# Patient Record
Sex: Female | Born: 1980
Health system: Southern US, Community
[De-identification: ages and names within clinical notes are randomized; demographics above are authoritative.]

## PROBLEM LIST (undated history)

## (undated) DIAGNOSIS — N390 Urinary tract infection, site not specified: Secondary | ICD-10-CM

## (undated) DIAGNOSIS — G43909 Migraine, unspecified, not intractable, without status migrainosus: Secondary | ICD-10-CM

## (undated) DIAGNOSIS — E7800 Pure hypercholesterolemia, unspecified: Secondary | ICD-10-CM

## (undated) DIAGNOSIS — B019 Varicella without complication: Secondary | ICD-10-CM

## (undated) DIAGNOSIS — J45990 Exercise induced bronchospasm: Secondary | ICD-10-CM

## (undated) HISTORY — DX: Pure hypercholesterolemia, unspecified: E78.00

## (undated) HISTORY — DX: Varicella without complication: B01.9

## (undated) HISTORY — DX: Urinary tract infection, site not specified: N39.0

## (undated) HISTORY — DX: Migraine, unspecified, not intractable, without status migrainosus: G43.909

## (undated) HISTORY — PX: WISDOM TOOTH EXTRACTION: SHX21

## (undated) HISTORY — DX: Exercise induced bronchospasm: J45.990

---
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
## 2000-02-10 ENCOUNTER — Other Ambulatory Visit: Admission: RE | Admit: 2000-02-10 | Discharge: 2000-02-10 | Payer: Self-pay | Admitting: Obstetrics and Gynecology

## 2001-01-18 ENCOUNTER — Encounter: Payer: Self-pay | Admitting: Family Medicine

## 2001-01-18 ENCOUNTER — Encounter: Admission: RE | Admit: 2001-01-18 | Discharge: 2001-01-18 | Payer: Self-pay | Admitting: Family Medicine

## 2001-04-12 ENCOUNTER — Other Ambulatory Visit: Admission: RE | Admit: 2001-04-12 | Discharge: 2001-04-12 | Payer: Self-pay | Admitting: Obstetrics and Gynecology

## 2008-05-05 ENCOUNTER — Inpatient Hospital Stay (HOSPITAL_COMMUNITY): Admission: AD | Admit: 2008-05-05 | Discharge: 2008-05-08 | Payer: Self-pay | Admitting: Obstetrics & Gynecology

## 2011-02-01 NOTE — Op Note (Signed)
Melissa Owens, CASTELL                  ACCOUNT NO.:  [id number]   MEDICAL RECORD NO.:  [id number]          PATIENT TYPE:  INP   LOCATION:  9130                          FACILITY:  [facility]   PHYSICIAN:  Lenoard Aden, M.D.DATE OF BIRTH:  [birth year]   DATE OF PROCEDURE:  05/05/2008  DATE OF DISCHARGE:                               OPERATIVE REPORT   PREOPERATIVE DIAGNOSIS:  Failure to descend at term.   POSTOPERATIVE DIAGNOSIS:  Failure to descend at term, plus right occiput  posterior position.   PROCEDURE:  Primary low-segment transverse cesarean section.   SURGEON:  Lenoard Aden, MD.   ANESTHESIA:  Epidural.   ESTIMATED BLOOD LOSS:  800 mL.   COMPLICATIONS:  None.   DRAINS:  Foley.   COUNTS:  Correct.   The patient was taken to recovery in good condition.   BRIEF OPERATIVE NOTE:  After being apprised of risks of anesthesia,  infection, bleeding, intra-abdominal injury, need for repair, delayed  versus bleeding complications to include bowel and bladder injury, the  patient was brought to the operating room where she was administered  dosing of epidural anesthetic without complications, prepped and draped  in usual sterile fashion.  A Foley catheter was placed.  After achieving  adequate anesthesia, the Marcaine solution was placed.  A Pfannenstiel  skin incision was made with the scalpel and carried down to fascia,  which was nicked in midline and openly transversely using Mayo scissors.  Rectus muscle was dissected sharply in midline.  Peritoneum entered  sharply.  Bladder blade was placed.  Visceral peritoneum scoured sharply  off the lower uterine segment.  A Kerr hysterotomy incision made,  atraumatic delivery, full-term living female from right occiput  posterior position, handed to pediatrician's attendants, Apgars of 8 and  9.  Cord blood collected.  Placenta delivered manually, intact 3-vessel  cord noted, uterus exteriorized, and normal tubes and ovaries  were  noted.  Uterus was curetted using a dry lap pack and closed in 2 running  imbricating layers of 0 Monocryl suture.  Good hemostasis was noted.  Second running layer was placed and interrupted suture was placed in the  midline for hemostasis and at the left lateral margin at this time,  irrigation was accomplished.  Bladder flap inspected and found to be  hemostatic.  Peritoneum inspected and found to be hemostatic.  Fascia then closed  using 0 Monocryl in running fashion.  Skin was closed using staples.  The patient tolerated the procedure well and was transferred to recovery  in good condition.      Lenoard Aden, M.D.  Electronically Signed     RJT/MEDQ  D:  05/05/2008  T:  05/06/2008  Job:  [id number]

## 2015-09-25 DIAGNOSIS — Z0000 Encounter for general adult medical examination without abnormal findings: Secondary | ICD-10-CM | POA: Diagnosis not present

## 2015-09-29 MED FILL — ROSUVASTATIN CALCIUM 5 MG T: 5 | 90 days supply | Qty: 20 | Fill #1

## 2015-10-23 DIAGNOSIS — R799 Abnormal finding of blood chemistry, unspecified: Secondary | ICD-10-CM | POA: Diagnosis not present

## 2015-10-27 DIAGNOSIS — D473 Essential (hemorrhagic) thrombocythemia: Secondary | ICD-10-CM | POA: Diagnosis not present

## 2015-11-19 MED FILL — NORETHIND-ETH ESTRAD 1-0.02: 1-20 | 84 days supply | Qty: 84 | Fill #1

## 2015-12-10 MED FILL — BUPROPION HCL XL 300 MG TAB: 300 | 90 days supply | Qty: 90 | Fill #0

## 2016-01-05 MED FILL — ROSUVASTATIN CALCIUM 5 MG T: 5 | 90 days supply | Qty: 20 | Fill #0

## 2016-02-18 MED FILL — NORETHIND-ETH ESTRAD 1-0.02: 1-20 | 84 days supply | Qty: 84 | Fill #2

## 2016-03-09 DIAGNOSIS — E7800 Pure hypercholesterolemia, unspecified: Secondary | ICD-10-CM | POA: Diagnosis not present

## 2016-03-09 DIAGNOSIS — J45990 Exercise induced bronchospasm: Secondary | ICD-10-CM | POA: Diagnosis not present

## 2016-03-09 DIAGNOSIS — N943 Premenstrual tension syndrome: Secondary | ICD-10-CM | POA: Diagnosis not present

## 2016-03-14 MED FILL — BUPROPION HCL XL 300 MG TAB: 300 | 90 days supply | Qty: 90 | Fill #0

## 2016-04-19 MED FILL — ROSUVASTATIN CALCIUM 5 MG T: 5 | 90 days supply | Qty: 18 | Fill #0

## 2016-06-14 MED FILL — NORETHIND-ETH ESTRAD 1-0.02: 1-20 | 84 days supply | Qty: 84 | Fill #3

## 2016-06-14 MED FILL — BUPROPION HCL XL 300 MG TAB: 300 | 90 days supply | Qty: 90 | Fill #1

## 2016-07-19 DIAGNOSIS — Z01419 Encounter for gynecological examination (general) (routine) without abnormal findings: Secondary | ICD-10-CM | POA: Diagnosis not present

## 2016-07-19 DIAGNOSIS — Z6821 Body mass index (BMI) 21.0-21.9, adult: Secondary | ICD-10-CM | POA: Diagnosis not present

## 2016-07-19 DIAGNOSIS — Z23 Encounter for immunization: Secondary | ICD-10-CM | POA: Diagnosis not present

## 2016-07-19 DIAGNOSIS — Z1151 Encounter for screening for human papillomavirus (HPV): Secondary | ICD-10-CM | POA: Diagnosis not present

## 2016-07-26 MED FILL — ROSUVASTATIN CALCIUM 5 MG T: 5 | 90 days supply | Qty: 18 | Fill #1

## 2016-09-09 DIAGNOSIS — Z0000 Encounter for general adult medical examination without abnormal findings: Secondary | ICD-10-CM | POA: Diagnosis not present

## 2016-09-09 DIAGNOSIS — F339 Major depressive disorder, recurrent, unspecified: Secondary | ICD-10-CM | POA: Diagnosis not present

## 2016-09-09 DIAGNOSIS — E784 Other hyperlipidemia: Secondary | ICD-10-CM | POA: Diagnosis not present

## 2016-09-09 LAB — CBC AND DIFFERENTIAL
HCT: 40 (ref 36–46)
Hemoglobin: 12.8 (ref 12.0–16.0)
Platelets: 446 — AB (ref 150–399)
WBC: 7.5

## 2016-09-09 LAB — LIPID PANEL
CHOLESTEROL: 191 (ref 0–200)
HDL: 70 (ref 35–70)
LDL CALC: 104
TRIGLYCERIDES: 84 (ref 40–160)

## 2016-09-09 LAB — BASIC METABOLIC PANEL
BUN: 10 (ref 4–21)
CREATININE: 0.9 (ref 0.5–1.1)

## 2016-09-09 MED FILL — BUPROPION HCL XL 300 MG TAB: 300 | 90 days supply | Qty: 90 | Fill #0

## 2016-10-19 MED FILL — NORETHIND-ETH ESTRAD 1-0.02: 1-20 | 84 days supply | Qty: 84 | Fill #0

## 2016-11-01 MED FILL — ROSUVASTATIN CALCIUM 5 MG T: 5 | 90 days supply | Qty: 20 | Fill #1

## 2017-01-03 MED FILL — BUPROPION HCL XL 300 MG TAB: 300 | 90 days supply | Qty: 90 | Fill #1

## 2017-02-21 MED FILL — NORETHIND-ETH ESTRAD 1-0.02: 1-20 | 84 days supply | Qty: 84 | Fill #1

## 2017-03-23 DIAGNOSIS — R030 Elevated blood-pressure reading, without diagnosis of hypertension: Secondary | ICD-10-CM | POA: Diagnosis not present

## 2017-03-23 DIAGNOSIS — E782 Mixed hyperlipidemia: Secondary | ICD-10-CM | POA: Diagnosis not present

## 2017-03-23 DIAGNOSIS — J45990 Exercise induced bronchospasm: Secondary | ICD-10-CM | POA: Diagnosis not present

## 2017-03-23 DIAGNOSIS — F339 Major depressive disorder, recurrent, unspecified: Secondary | ICD-10-CM | POA: Diagnosis not present

## 2017-03-23 LAB — LIPID PANEL
Cholesterol: 196 (ref 0–200)
HDL: 65 (ref 35–70)
LDL CALC: 117
Triglycerides: 70 (ref 40–160)

## 2017-03-23 LAB — HEPATIC FUNCTION PANEL
ALK PHOS: 54 (ref 25–125)
ALT: 17 (ref 7–35)
AST: 20 (ref 13–35)
BILIRUBIN, TOTAL: 0.2

## 2017-03-23 LAB — BASIC METABOLIC PANEL
BUN: 9 (ref 4–21)
Creatinine: 0.7 (ref 0.5–1.1)
GLUCOSE: 78
Potassium: 4.9 (ref 3.4–5.3)
Sodium: 139 (ref 137–147)

## 2017-03-23 MED FILL — VENTOLIN HFA 90 MCG INHALER: 108 (90 BAS | 17 days supply | Qty: 18 | Fill #0

## 2017-03-23 MED FILL — BUPROPION HCL XL 300 MG TAB: 300 | 90 days supply | Qty: 90 | Fill #0

## 2017-03-23 MED FILL — ROSUVASTATIN CALCIUM 5 MG T: 5 | 84 days supply | Qty: 12 | Fill #0

## 2017-04-13 MED FILL — QVAR REDIHALER 40 MCG/ACT A: 40 | 30 days supply | Qty: 11 | Fill #0

## 2017-06-23 MED FILL — NORETHIND-ETH ESTRAD 1-0.02: 1-20 | 84 days supply | Qty: 84 | Fill #2

## 2017-06-27 MED FILL — ROSUVASTATIN CALCIUM 5 MG T: 5 | 84 days supply | Qty: 12 | Fill #1

## 2017-07-05 DIAGNOSIS — R7989 Other specified abnormal findings of blood chemistry: Secondary | ICD-10-CM | POA: Diagnosis not present

## 2017-07-27 DIAGNOSIS — Z6823 Body mass index (BMI) 23.0-23.9, adult: Secondary | ICD-10-CM | POA: Diagnosis not present

## 2017-07-27 DIAGNOSIS — Z01419 Encounter for gynecological examination (general) (routine) without abnormal findings: Secondary | ICD-10-CM | POA: Diagnosis not present

## 2017-07-27 DIAGNOSIS — Z1151 Encounter for screening for human papillomavirus (HPV): Secondary | ICD-10-CM | POA: Diagnosis not present

## 2017-07-28 MED FILL — BUPROPION HCL XL 300 MG TAB: 300 | 60 days supply | Qty: 60 | Fill #1

## 2017-09-20 MED FILL — NORETHIND-ETH ESTRAD 1-0.02: 1-20 | 84 days supply | Qty: 84 | Fill #0

## 2017-09-20 MED FILL — BUPROPION HCL XL 300 MG TAB: 300 | 30 days supply | Qty: 30 | Fill #2

## 2017-09-26 MED FILL — ROSUVASTATIN CALCIUM 5 MG T: 5 | 84 days supply | Qty: 12 | Fill #0

## 2017-11-09 MED FILL — BUPROPION HCL XL 300 MG TAB: 300 | 90 days supply | Qty: 90 | Fill #0

## 2017-11-21 ENCOUNTER — Encounter: Payer: Self-pay | Admitting: Family Medicine

## 2017-11-21 ENCOUNTER — Telehealth: Payer: Self-pay | Admitting: Family Medicine

## 2017-11-21 DIAGNOSIS — J45990 Exercise induced bronchospasm: Secondary | ICD-10-CM

## 2017-11-21 DIAGNOSIS — E7849 Other hyperlipidemia: Secondary | ICD-10-CM | POA: Insufficient documentation

## 2017-11-21 DIAGNOSIS — F338 Other recurrent depressive disorders: Secondary | ICD-10-CM

## 2017-11-21 HISTORY — DX: Exercise induced bronchospasm: J45.990

## 2017-11-21 NOTE — Telephone Encounter (Signed)
Received records from [facility], Evang[place] for this patient.  Will abstract and scan as appropriate It looks like she had a CPE on December 2017 They have been monitoring her BP but did not start her on any medication as of yet

## 2017-11-28 NOTE — Progress Notes (Addendum)
[place] at [facility] [address], [place], [place] [address] [phone]  Date:  11/29/2017   Name:  Melissa Owens   DOB:  Aug 23, 1981   MRN:  [id number]  PCP:  Darreld Mclean, MD    Chief Complaint: No chief complaint on file.   History of Present Illness:  Melissa Owens is a 37 y.o. very pleasant female patient who presents with the following:  Here today as a new patient- formerly a Lobbyist pt, [facility] She is generally in good health   Pap: per her GYN  Labs: she thinks about last June  Flu: UTD Tetanus:  UTD  Her husband is Dr. [name], our pharmacist at the [facility] pharmacy She is on medication for her cholesterol  Crestor for her cholesterol   She does see Aloha Gell for her GYN  They have 3 children 9, 9, 7- 2 are adopted from [place], one bio 2 girls and a boy They all attend the same school.   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   Her mood was ok over this past winter - she is on wellbutrin for SAD  No tobacco or drugs  Patient Active Problem List   Diagnosis Date Noted  . Seasonal affective disorder ([place]) 11/21/2017  . 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 Exercise-induced asthma 11/21/2017    Past Medical History:  Diagnosis Date  . Chicken pox   . Exercise-induced asthma 11/21/2017  . High cholesterol   . Migraines   . Urinary tract infection     Past Surgical History:  Procedure Laterality Date  . CESAREAN SECTION  2009  . WISDOM TOOTH EXTRACTION      Social History   Tobacco Use  . Smoking status: Not on file  Substance Use Topics  . Alcohol use: Not on file   . Drug use: Not on file    No family history on file.  Allergies  Allergen Reactions  . Sulfa Antibiotics Hives    Medication list has been reviewed and updated.  Current Outpatient Medications on File Prior to Visit  Medication Sig Dispense Refill  . albuterol (PROVENTIL HFA;VENTOLIN HFA) 108 (90 Base) MCG/ACT inhaler Inhale 2 puffs into the lungs as needed. Inhale two puffs into the lungs every 6 hours as needed.    Marland Kitchen buPROPion (WELLBUTRIN XL) 300 MG 24 hr tablet TAKE 1 TABLET (300 MG TOTAL) BY MOUTH DAILY.    . Multiple Vitamins-Minerals (WOMENS MULTIVITAMIN PO) Take 1 tablet by mouth daily.    . norethindrone-ethinyl estradiol (MICROGESTIN,JUNEL,LOESTRIN) 1-20 MG-MCG tablet Take 1 tablet daily for 21 days per month  4  . rosuvastatin (CRESTOR) 5 MG tablet Take 1/2 tablet (2/5 mg) twice per week  0   No current facility-administered medications on file prior to visit.     Review of Systems:  As per HPI- otherwise negative.   Physical Examination: Vitals:   11/29/17 1159  BP: 132/82  Pulse: 93  Temp: 98.6 F (37 C)  SpO2: 99%   Vitals:   11/29/17 1159  Weight: 133 lb 3.2 oz (60.4 kg)  Height: 5\' 2"  (1.575 m)   Body mass index is 24.36 kg/m. Ideal Body Weight: Weight in (lb) to have BMI = 25: 136.4  GEN: WDWN, NAD, Non-toxic, A & O x 3, looks well  HEENT: Atraumatic, Normocephalic. Neck supple. No masses, No LAD. Bilateral TM wnl, oropharynx normal.  PEERL,EOMI.   Ears and Nose: No external deformity. CV: RRR, No M/G/R. No JVD. No thrill. No extra heart sounds. PULM: CTA B, no wheezes, crackles, rhonchi. No retractions. No resp. distress. No accessory muscle use. ABD: S, NT, ND. EXTR: No c/c/e NEURO Normal gait.  PSYCH: Normally interactive. Conversant. Not depressed or anxious appearing.  Calm demeanor.    Assessment and Plan: Dyslipidemia - Plan: Comprehensive metabolic panel, Lipid panel  Screening for deficiency anemia - Plan: CBC  Screening for  diabetes mellitus - Plan: Comprehensive metabolic panel, Hemoglobin A1c  Family history of hypertension  Here today as a new patient- new insurance so she is changing to this practice.  Her husband is a [employer] employee EIA- controlled Hyperlipidemia She will come in for fasting labs soon She sees GYN She will monitor her BP- family history of HTN   Signed Lamar Blinks, MD  Received her labs 3/14- letter to pt  Lab Results  Component Value Date   WBC 6.4 11/30/2017   HGB 13.8 11/30/2017   HCT 41.4 11/30/2017   MCV 85.8 11/30/2017   PLT 367.0 11/30/2017     Chemistry      Component Value Date/Time   NA 138 11/30/2017 0815   K 4.2 11/30/2017 0815   CL 104 11/30/2017 0815   CO2 29 11/30/2017 0815   BUN 11 11/30/2017 0815   CREATININE 0.98 11/30/2017 0815      Component Value Date/Time   CALCIUM 9.6 11/30/2017 0815   ALKPHOS 49 11/30/2017 0815   AST 21 11/30/2017 0815   ALT 21 11/30/2017 0815   BILITOT 0.4 11/30/2017 0815     Lab Results  Component Value Date   HGBA1C 5.3 11/30/2017   Lipids:    Component Value Date/Time   CHOL 186 11/30/2017 0815   TRIG 79.0 11/30/2017 0815   HDL 66.40 11/30/2017 0815   VLDL 15.8 11/30/2017 0815   CHOLHDL 3 11/30/2017 0815   Lab Results  Component Value Date   LDLCALC 104 (H) 11/30/2017

## 2017-11-29 ENCOUNTER — Encounter: Payer: Self-pay | Admitting: Family Medicine

## 2017-11-29 ENCOUNTER — Ambulatory Visit (INDEPENDENT_AMBULATORY_CARE_PROVIDER_SITE_OTHER): Payer: No Typology Code available for payment source | Admitting: Family Medicine

## 2017-11-29 VITALS — BP 132/82 | HR 93 | Temp 98.6°F | Ht 62.0 in | Wt 133.2 lb

## 2017-11-29 DIAGNOSIS — Z130 Encounter for screening for diseases of the blood and blood-forming organs and certain disorders involving the immune mechanism: Secondary | ICD-10-CM

## 2017-11-29 DIAGNOSIS — E785 Hyperlipidemia, unspecified: Secondary | ICD-10-CM | POA: Diagnosis not present

## 2017-11-29 DIAGNOSIS — Z131 Encounter for screening for diabetes mellitus: Secondary | ICD-10-CM

## 2017-11-29 DIAGNOSIS — Z8249 Family history of ischemic heart disease and other diseases of the circulatory system: Secondary | ICD-10-CM | POA: Diagnosis not present

## 2017-11-29 NOTE — Patient Instructions (Signed)
It was a pleasure to meet you today!  Take care and I will be in touch with your labs; come by when you are fasting at your conveneice and we can draw blood for you.   We will continue to monitor your BP- perhaps check it at home once or twice a month.   Let me know when you need refills and have a wonderful spring

## 2017-11-30 ENCOUNTER — Other Ambulatory Visit (INDEPENDENT_AMBULATORY_CARE_PROVIDER_SITE_OTHER): Payer: No Typology Code available for payment source

## 2017-11-30 DIAGNOSIS — E785 Hyperlipidemia, unspecified: Secondary | ICD-10-CM

## 2017-11-30 DIAGNOSIS — Z131 Encounter for screening for diabetes mellitus: Secondary | ICD-10-CM

## 2017-11-30 DIAGNOSIS — Z130 Encounter for screening for diseases of the blood and blood-forming organs and certain disorders involving the immune mechanism: Secondary | ICD-10-CM

## 2017-11-30 LAB — COMPREHENSIVE METABOLIC PANEL
ALBUMIN: 4.2 g/dL (ref 3.5–5.2)
ALT: 21 U/L (ref 0–35)
AST: 21 U/L (ref 0–37)
Alkaline Phosphatase: 49 U/L (ref 39–117)
BUN: 11 mg/dL (ref 6–23)
CHLORIDE: 104 meq/L (ref 96–112)
CO2: 29 mEq/L (ref 19–32)
Calcium: 9.6 mg/dL (ref 8.4–10.5)
Creatinine, Ser: 0.98 mg/dL (ref 0.40–1.20)
GFR: 67.85 mL/min (ref >60.00)
Glucose, Bld: 88 mg/dL (ref 70–99)
Potassium: 4.2 mEq/L (ref 3.5–5.1)
Sodium: 138 mEq/L (ref 135–145)
TOTAL PROTEIN: 7.1 g/dL (ref 6.0–8.3)
Total Bilirubin: 0.4 mg/dL (ref 0.2–1.2)

## 2017-11-30 LAB — CBC
HEMATOCRIT: 41.4 % (ref 36.0–46.0)
Hemoglobin: 13.8 g/dL (ref 12.0–15.0)
MCHC: 33.4 g/dL (ref 30.0–36.0)
MCV: 85.8 fl (ref 78.0–100.0)
Platelets: 367 10*3/uL (ref 150.0–400.0)
RBC: 4.82 Mil/uL (ref 3.87–5.11)
RDW: 15.4 % (ref 11.5–15.5)
WBC: 6.4 10*3/uL (ref 4.0–10.5)

## 2017-11-30 LAB — LIPID PANEL
CHOLESTEROL: 186 mg/dL (ref 0–200)
HDL: 66.4 mg/dL (ref >39.00)
LDL CALC: 104 mg/dL — AB (ref 0–99)
NonHDL: 119.81
Total CHOL/HDL Ratio: 3
Triglycerides: 79 mg/dL (ref 0.0–149.0)
VLDL: 15.8 mg/dL (ref 0.0–40.0)

## 2017-11-30 LAB — HEMOGLOBIN A1C: Hgb A1c MFr Bld: 5.3 % (ref 4.6–6.5)

## 2017-12-01 ENCOUNTER — Encounter: Payer: Self-pay | Admitting: Family Medicine

## 2017-12-22 ENCOUNTER — Other Ambulatory Visit: Payer: Self-pay | Admitting: Emergency Medicine

## 2017-12-22 MED ORDER — ROSUVASTATIN CALCIUM 5 MG PO TABS: ORAL_TABLET | ORAL | 1 refills | Status: DC

## 2017-12-22 MED FILL — ROSUVASTATIN CALCIUM 5 MG T: 5 | 84 days supply | Qty: 12 | Fill #0

## 2017-12-22 NOTE — Telephone Encounter (Signed)
Received refill request for rosuvastatin calcium 5 mg tab.  Last office visit 11/29/17. Last refill not listed. Refill sent to pt's pharmacy as requested.

## 2018-02-02 MED FILL — NORETHIND-ETH ESTRAD 1-0.02: 1-20 | 84 days supply | Qty: 84 | Fill #1

## 2018-02-06 ENCOUNTER — Other Ambulatory Visit: Payer: Self-pay

## 2018-02-06 MED ORDER — BUPROPION HCL ER (XL) 300 MG PO TB24: ORAL_TABLET | ORAL | 0 refills | Status: DC

## 2018-02-06 MED FILL — buPROPion HCL ER (XL) 300 M: 300 | 90 days supply | Qty: 90 | Fill #0

## 2018-05-31 ENCOUNTER — Other Ambulatory Visit: Payer: Self-pay | Admitting: Family Medicine

## 2018-05-31 MED FILL — BuPROPion HCL ER (XL) 300 M: 300 | 90 days supply | Qty: 90 | Fill #0

## 2018-05-31 MED FILL — NORETHIND-ETH ESTRAD 1-0.02: 1-20 | 84 days supply | Qty: 84 | Fill #2

## 2018-07-26 ENCOUNTER — Telehealth: Payer: Self-pay | Admitting: Family Medicine

## 2018-07-26 NOTE — Telephone Encounter (Signed)
Copied from [place] [id number]. Topic: Quick Communication - See Telephone Encounter >> Jul 26, 2018  7:37 AM Conception Chancy, NT wrote: CRM for notification. See Telephone encounter for: 07/26/18.  Patient is calling to inform Dr. Lorelei Pont of her BP readings she has been monitoring per Dr. Lorelei Pont.  07/24/18 AM 131/81 PM 133/87 irregular heart beat  07/25/18 AM 134/78 PM 140/81 irregular heart beat  07/26/18 AM 139/86 irregular heart beat  She would like to know based off these readings does Dr. Lorelei Pont want to see her again.

## 2018-07-26 NOTE — Telephone Encounter (Signed)
Called her back- she actually was wrong about the irregular heart beat, mis-read a symbol on her home cuff Advised that her BP is borderline, but not to where I would put her on medication at this time.  We will want to monitor her BP- she will check her BP about twice a month and let mer know if any significant change or increase

## 2018-08-07 MED FILL — NORETHIND-ETH ESTRAD 1-0.02: 1-20 | 84 days supply | Qty: 84 | Fill #0

## 2018-08-16 NOTE — Progress Notes (Signed)
[place] at [facility] [address], [place], [place] [address] [phone]  Date:  08/20/2018   Name:  Melissa Owens   DOB:  1981/08/04   MRN:  [id number]  PCP:  Melissa Mclean, MD    Chief Complaint: Medication Follow Up (wellbutrin) and URI (fever, chills, almost a week, productive cough, takin tamiflu since friday)   History of Present Illness:  Melissa Owens is a 37 y.o. very pleasant female patient who presents with the following:  History of SAD, hyperlipidemia Last seen here in March  Her husband is Dr. Luetta Owens, our pharmacist at the [facility] pharmacy She is on medication for her cholesterol  Crestor for her cholesterol  She does see Melissa Owens for her GYN They have 3 children 9, 9, 7- 2 are adopted from [place], one bio 2 girls and a boy They all attend the same school.   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 For exercise she enjoys lifting weights, yoga, a bit of running and walking  Had scheduled this as a med check visit but she is actually sick today She has noted fever, aches, cough, temp to 101.3 for the last 6 days Some ST but this is minor No vomiting A bit of nausea Her kids have been ok She is taking tamiflu as of Friday- her husband Melissa Owens got some for him to use as prophylaxis for flu  She did have a flu shot  Her mood has been ok on her current dose of wellbutrin and she wishes to continue taking this  Her children are 10,10 and 45 yo - all in good health   She is on OCP and LMP was 3-4 weeks ago  She is no longer taking the crestor   Patient Active Problem List   Diagnosis Date Noted  . Seasonal affective disorder ([place]) 11/21/2017  . 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 Exercise-induced asthma 11/21/2017    Past Medical History:  Diagnosis Date  . Chicken pox   . Exercise-induced asthma 11/21/2017  . High  cholesterol   . Migraines   . Urinary tract infection     Past Surgical History:  Procedure Laterality Date  . CESAREAN SECTION  2009  . WISDOM TOOTH EXTRACTION      Social History   Tobacco Use  . Smoking status: Never Smoker  . Smokeless tobacco: Never Used  Substance Use Topics  . Alcohol use: Not on file  . Drug use: Not on file    Family History  Problem Relation Age of Onset  . Hypertension Mother   . High Cholesterol Mother   . Cancer Father   . Heart attack Father   . Heart disease Father   . Arthritis Maternal Grandmother   . Cancer Maternal Grandmother   . High Cholesterol Maternal Grandmother   . High blood pressure Maternal Grandmother   . Stroke Maternal Grandmother   . Arthritis Maternal Grandfather   . Diabetes Maternal Grandfather   . Hearing loss Maternal Grandfather   . High Cholesterol Maternal Grandfather   . High blood pressure Maternal Grandfather   . Asthma Paternal Grandmother   . Diabetes Paternal Grandmother   . Miscarriages / Stillbirths Paternal Grandmother   . COPD Paternal Grandfather   . Alcohol abuse Brother   . Drug abuse Brother     Allergies  Allergen Reactions  .  Sulfa Antibiotics Hives    Medication list has been reviewed and updated.  Current Outpatient Medications on File Prior to Visit  Medication Sig Dispense Refill  . buPROPion (WELLBUTRIN XL) 300 MG 24 hr tablet TAKE 1 TABLET (300 MG TOTAL) BY MOUTH DAILY. 90 tablet 0  . Multiple Vitamins-Minerals (WOMENS MULTIVITAMIN PO) Take 1 tablet by mouth daily.    . norethindrone-ethinyl estradiol (MICROGESTIN,JUNEL,LOESTRIN) 1-20 MG-MCG tablet Take 1 tablet daily for 21 days per month  4  . albuterol (PROVENTIL HFA;VENTOLIN HFA) 108 (90 Base) MCG/ACT inhaler Inhale 2 puffs into the lungs as needed. Inhale two puffs into the lungs every 6 hours as needed.    . rosuvastatin (CRESTOR) 5 MG tablet Take 1/2 tablet (2.5 mg) twice per week. (Patient not taking: Reported on  08/20/2018) 12 tablet 1   No current facility-administered medications on file prior to visit.     Review of Systems:  As per HPI- otherwise negative. She has not taken any fever reducers at home today    Physical Examination: Vitals:   08/20/18 1403  BP: 140/82  Pulse: (!) 130  Resp: 18  Temp: 99.3 F (37.4 C)  SpO2: 98%   Vitals:   08/20/18 1403  Weight: 141 lb (64 kg)  Height: 5\' 2"  (1.575 m)   Body mass index is 25.79 kg/m. Ideal Body Weight: Weight in (lb) to have BMI = 25: 136.4  GEN: WDWN, NAD, Non-toxic, A & O x 3, does not appear to feel great today  HEENT: Atraumatic, Normocephalic. Neck supple. No masses, No LAD. Bilateral TM wnl, oropharynx normal.  PEERL,EOMI.   Ears and Nose: No external deformity. CV: RRR- tachycardic, No M/G/R. No JVD. No thrill. No extra heart sounds. PULM: CTA B, no wheezes, crackles, rhonchi. No retractions. No resp. distress. No accessory muscle use. ABD: S, NT, ND. No rebound. No HSM. EXTR: No c/c/e NEURO Normal gait.  PSYCH: Normally interactive. Conversant. Not depressed or anxious appearing.  Calm demeanor.   Gave her 600 mg of ibuprofen in the clinic today for fever  Results for orders placed or performed in visit on 08/20/18  POCT Influenza A/B  Result Value Ref Range   Influenza A, POC Negative Negative   Influenza B, POC Negative Negative    Assessment and Plan: Fever, unspecified - Plan: POCT Influenza A/B, DG Chest 2 View, ibuprofen (ADVIL,MOTRIN) tablet 600 mg  Seasonal affective disorder (HCC) - Plan: buPROPion (WELLBUTRIN XL) 300 MG 24 hr tablet  Community acquired pneumonia of left lower lobe of lung ([place]) - Plan: azithromycin (ZITHROMAX) 250 MG tablet  Here today with fever, rapid flu negative. Will have her get a CXR to rule out pneumonia Pt went to  imaging for cost savings, will call her with result Refilled her wellbutrin today   Signed Lamar Blinks, MD  Received her chest film- she does  have pneumonia rx for azithromycin sent in for her Rest. Fluids, fever reducers as needed Order repeat CXR for her to do in one month  Dg Chest 2 View  Result Date: 08/20/2018 CLINICAL DATA:  Fever and cough. EXAM: CHEST - 2 VIEW COMPARISON:  None. FINDINGS: Opacity projects over the left hilum on the frontal view and posteriorly on the lateral view. The heart, right hilum, mediastinum, and pleura are otherwise normal. No other pulmonary abnormalities. IMPRESSION: The findings are most consistent with pneumonia in the superior segment of the left lower lobe. Recommend short-term follow-up after treatment to ensure resolution, to exclude other potential  etiologies. Electronically Signed   By: Dorise Bullion III M.D   On: 08/20/2018 15:29   Meds ordered this encounter  Medications  . buPROPion (WELLBUTRIN XL) 300 MG 24 hr tablet    Sig: Take 1 tablet by mouth daily    Dispense:  90 tablet    Refill:  3    NEEDS OV FOR FURTHER REFILLS  . ibuprofen (ADVIL,MOTRIN) tablet 600 mg  . azithromycin (ZITHROMAX) 250 MG tablet    Sig: Use as a zpack    Dispense:  6 tablet    Refill:  0

## 2018-08-19 DIAGNOSIS — I7774 Dissection of vertebral artery: Secondary | ICD-10-CM

## 2018-08-19 HISTORY — DX: Dissection of vertebral artery: I77.74

## 2018-08-20 ENCOUNTER — Encounter: Payer: Self-pay | Admitting: Family Medicine

## 2018-08-20 ENCOUNTER — Ambulatory Visit
Admission: RE | Admit: 2018-08-20 | Discharge: 2018-08-20 | Disposition: A | Discharge status: Home / Self Care | Payer: No Typology Code available for payment source | Source: Ambulatory Visit | Attending: Family Medicine | Admitting: Family Medicine

## 2018-08-20 ENCOUNTER — Other Ambulatory Visit: Payer: Self-pay | Admitting: Family Medicine

## 2018-08-20 ENCOUNTER — Telehealth: Payer: Self-pay | Admitting: Family Medicine

## 2018-08-20 ENCOUNTER — Ambulatory Visit (INDEPENDENT_AMBULATORY_CARE_PROVIDER_SITE_OTHER): Payer: No Typology Code available for payment source | Admitting: Family Medicine

## 2018-08-20 VITALS — BP 140/82 | HR 120 | Temp 99.3°F | Resp 18 | Ht 62.0 in | Wt 141.0 lb

## 2018-08-20 DIAGNOSIS — F338 Other recurrent depressive disorders: Secondary | ICD-10-CM | POA: Diagnosis not present

## 2018-08-20 DIAGNOSIS — J181 Lobar pneumonia, unspecified organism: Principal | ICD-10-CM

## 2018-08-20 DIAGNOSIS — R509 Fever, unspecified: Secondary | ICD-10-CM

## 2018-08-20 DIAGNOSIS — J189 Pneumonia, unspecified organism: Secondary | ICD-10-CM

## 2018-08-20 LAB — POCT INFLUENZA A/B
INFLUENZA A, POC: NEGATIVE
Influenza B, POC: NEGATIVE

## 2018-08-20 MED ORDER — AZITHROMYCIN 250 MG PO TABS: ORAL_TABLET | ORAL | 0 refills | Status: DC

## 2018-08-20 MED ORDER — BUPROPION HCL ER (XL) 300 MG PO TB24: ORAL_TABLET | ORAL | 3 refills | Status: DC

## 2018-08-20 MED ORDER — IBUPROFEN 200 MG PO TABS: 600.0000 mg | ORAL_TABLET | Freq: Once | ORAL | Status: AC

## 2018-08-20 MED FILL — buPROPion HCL ER (XL) 300 M: 300 | 90 days supply | Qty: 90 | Fill #0

## 2018-08-20 MED FILL — AZITHROMYCIN 250 MG TABLET: 250 | 5 days supply | Qty: 6 | Fill #0

## 2018-08-20 NOTE — Patient Instructions (Signed)
Your flu test was negative- this means you could have another problem like pneumonia We have ordered a chest x-ray for you; please go to the [facility] location on [address] and they will do your chest film. I will call you with this result asap   I am sorry that you are feeling so bad!

## 2018-08-20 NOTE — Telephone Encounter (Signed)
Taken care of

## 2018-08-20 NOTE — Telephone Encounter (Signed)
[facility] calling to relay PNA diagnosis. Routed to PCP to review.

## 2018-08-20 NOTE — Telephone Encounter (Signed)
'  Sheral Flow' with [facility] calling to report CXR results.  TN called practice, spoke to [facility] who will alert Dr. Lorelei Pont.

## 2018-08-23 ENCOUNTER — Encounter: Payer: Self-pay | Admitting: Family Medicine

## 2018-08-24 ENCOUNTER — Other Ambulatory Visit: Payer: Self-pay | Admitting: Family

## 2018-08-24 ENCOUNTER — Other Ambulatory Visit (INDEPENDENT_AMBULATORY_CARE_PROVIDER_SITE_OTHER): Payer: No Typology Code available for payment source

## 2018-08-24 ENCOUNTER — Ambulatory Visit (INDEPENDENT_AMBULATORY_CARE_PROVIDER_SITE_OTHER): Payer: No Typology Code available for payment source | Admitting: Family

## 2018-08-24 ENCOUNTER — Encounter: Payer: Self-pay | Admitting: Family

## 2018-08-24 ENCOUNTER — Encounter: Payer: Self-pay | Admitting: Family Medicine

## 2018-08-24 VITALS — BP 134/80 | HR 98 | Temp 97.9°F | Ht 62.0 in | Wt 137.1 lb

## 2018-08-24 DIAGNOSIS — R51 Headache: Secondary | ICD-10-CM

## 2018-08-24 DIAGNOSIS — R519 Headache, unspecified: Secondary | ICD-10-CM

## 2018-08-24 LAB — COMPREHENSIVE METABOLIC PANEL
ALT: 28 U/L (ref 0–35)
AST: 25 U/L (ref 0–37)
Albumin: 4.3 g/dL (ref 3.5–5.2)
Alkaline Phosphatase: 56 U/L (ref 39–117)
BUN: 13 mg/dL (ref 6–23)
CHLORIDE: 101 meq/L (ref 96–112)
CO2: 25 meq/L (ref 19–32)
Calcium: 9.8 mg/dL (ref 8.4–10.5)
Creatinine, Ser: 0.86 mg/dL (ref 0.40–1.20)
GFR: 78.58 mL/min (ref >60.00)
Glucose, Bld: 93 mg/dL (ref 70–99)
POTASSIUM: 4 meq/L (ref 3.5–5.1)
Sodium: 137 mEq/L (ref 135–145)
Total Bilirubin: 0.3 mg/dL (ref 0.2–1.2)
Total Protein: 8.5 g/dL — ABNORMAL HIGH (ref 6.0–8.3)

## 2018-08-24 LAB — CBC WITH DIFFERENTIAL/PLATELET
Basophils Absolute: 0.1 10*3/uL (ref 0.0–0.1)
Basophils Relative: 1 % (ref 0.0–3.0)
Eosinophils Absolute: 0.2 10*3/uL (ref 0.0–0.7)
Eosinophils Relative: 2.7 % (ref 0.0–5.0)
HCT: 41.8 % (ref 36.0–46.0)
Hemoglobin: 14 g/dL (ref 12.0–15.0)
Lymphocytes Relative: 26.8 % (ref 12.0–46.0)
Lymphs Abs: 1.9 10*3/uL (ref 0.7–4.0)
MCHC: 33.6 g/dL (ref 30.0–36.0)
MCV: 83.5 fl (ref 78.0–100.0)
Monocytes Absolute: 0.6 10*3/uL (ref 0.1–1.0)
Monocytes Relative: 7.7 % (ref 3.0–12.0)
NEUTROS ABS: 4.4 10*3/uL (ref 1.4–7.7)
Neutrophils Relative %: 61.8 % (ref 43.0–77.0)
Platelets: 644 10*3/uL — ABNORMAL HIGH (ref 150.0–400.0)
RBC: 5 Mil/uL (ref 3.87–5.11)
RDW: 14.1 % (ref 11.5–15.5)
WBC: 7.2 10*3/uL (ref 4.0–10.5)

## 2018-08-24 MED ORDER — KETOROLAC TROMETHAMINE 30 MG/ML IJ SOLN
30.0000 mg | Freq: Once | INTRAMUSCULAR | Status: AC
  Administered 2018-08-24: 30 mg via INTRAMUSCULAR

## 2018-08-24 MED ORDER — CYCLOBENZAPRINE HCL 10 MG PO TABS: 5.0000 mg | ORAL_TABLET | Freq: Three times a day (TID) | ORAL | 0 refills | Status: DC | PRN

## 2018-08-24 MED FILL — CYCLOBENZAPRINE HCL 10 MG T: 10 | 10 days supply | Qty: 30 | Fill #0

## 2018-08-24 NOTE — Progress Notes (Signed)
Melissa Owens is a 37 y.o. female with the following history as recorded in EpicCare:  Patient Active Problem List   Diagnosis Date Noted  . Seasonal affective disorder ([place]) 11/21/2017  . 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 Exercise-induced asthma 11/21/2017    Current Outpatient Medications  Medication Sig Dispense Refill  . azithromycin (ZITHROMAX) 250 MG tablet Use as a zpack 6 tablet 0  . buPROPion (WELLBUTRIN XL) 300 MG 24 hr tablet Take 1 tablet by mouth daily 90 tablet 3  . Multiple Vitamins-Minerals (WOMENS MULTIVITAMIN PO) Take 1 tablet by mouth daily.    . norethindrone-ethinyl estradiol (MICROGESTIN,JUNEL,LOESTRIN) 1-20 MG-MCG tablet Take 1 tablet daily for 21 days per month  4  . albuterol (PROVENTIL HFA;VENTOLIN HFA) 108 (90 Base) MCG/ACT inhaler Inhale 2 puffs into the lungs as needed. Inhale two puffs into the lungs every 6 hours as needed.     Current Facility-Administered Medications  Medication Dose Route Frequency Provider Last Rate Last Dose  . ibuprofen (ADVIL,MOTRIN) tablet 600 mg  600 mg Oral Once Copland, Gay Filler, MD        Allergies: Sulfa antibiotics  Past Medical History:  Diagnosis Date  . Chicken pox   . Exercise-induced asthma 11/21/2017  . High cholesterol   . Migraines   . Urinary tract infection     Past Surgical History:  Procedure Laterality Date  . CESAREAN SECTION  2009  . WISDOM TOOTH EXTRACTION      Family History  Problem Relation Age of Onset  . Hypertension Mother   . High Cholesterol Mother   . Cancer Father   . Heart attack Father   . Heart disease Father   . Arthritis Maternal Grandmother   . Cancer Maternal Grandmother   . High Cholesterol Maternal Grandmother   . High blood pressure Maternal Grandmother   . Stroke Maternal Grandmother   . Arthritis Maternal Grandfather   . Diabetes Maternal Grandfather   . Hearing loss Maternal Grandfather   . High Cholesterol Maternal Grandfather   . High blood pressure Maternal  Grandfather   . Asthma Paternal Grandmother   . Diabetes Paternal Grandmother   . Miscarriages / Stillbirths Paternal Grandmother   . COPD Paternal Grandfather   . Alcohol abuse Brother   . Drug abuse Brother     Social History   Tobacco Use  . Smoking status: Never Smoker  . Smokeless tobacco: Never Used  Substance Use Topics  . Alcohol use: Not on file    Subjective:  Patient was seen by her PCP on Monday with fever/ cough- diagnosed with pneumonia; was started on a Z-pak on Monday; thinks headache started Monday night or Tuesday morning; complaining of persisting headache x 4 days; complaining of pain with turning her neck- feels pain is localized in back of right side of head/ neck; denies any light sensitivity; does have a history of migraine headaches- notes that headaches typically start behind her right eye/ pain in the back of her neck; feels like her headache today is presenting differently than her normal headaches- never had a migraine that has lasted more than 5 days; does not remember if she has ever taken Zithromax before- does not think pain is related to taking the antibiotic. May get migraine every 4-6 months and normally respond to Excedrin Migraine within a few hours;   LMP- now    Objective:  Vitals:   08/24/18 0842  BP: 134/80  Pulse: 98  Temp: 97.9 F (36.6 C)  TempSrc:  Oral  SpO2: 98%  Weight: 137 lb 1.9 oz (62.2 kg)  Height: '5\' 2"'  (1.575 m)    General: Well developed, well nourished, in no acute distress  Skin : Warm and dry.  Head: Normocephalic and atraumatic  Eyes: Sclera and conjunctiva clear; pupils round and reactive to light; extraocular movements intact  Ears: External normal; canals clear; tympanic membranes normal  Oropharynx: Pink, supple. No suspicious lesions  Neck: Supple without thyromegaly, adenopathy  Lungs: Respirations unlabored; wheeze noted in upper left lobe CVS exam: normal rate and regular rhythm.  Abdomen: Soft; nontender;  nondistended; normoactive bowel sounds; no masses or hepatosplenomegaly  Musculoskeletal: No deformities; no active joint inflammation  Extremities: No edema, cyanosis, clubbing  Vessels: Symmetric bilaterally  Neurologic: Alert and oriented; speech intact; face symmetrical; moves all extremities well; CNII-XII intact without focal deficit   Assessment:  1. Nonintractable headache, unspecified chronicity pattern, unspecified headache type     Plan:  Physical exam is reassuring in office; no nuchal rigidity noted; will update STAT CBC, CMP today; labs are reassuring- will plan to treat for migraine headache in office; she will go home and see how she responds within 1-2 hours; if symptoms persist, will need to consider ER evaluation for more extensive work up.  She will plan to e-mail or call with her response within 1-2 hours with response.  No follow-ups on file.  Orders Placed This Encounter  Procedures  . CBC w/Diff    Standing Status:   Future    Number of Occurrences:   1    Standing Expiration Date:   08/24/2019  . Comp Met (CMET)    Standing Status:   Future    Number of Occurrences:   1    Standing Expiration Date:   08/24/2019    Requested Prescriptions    No prescriptions requested or ordered in this encounter

## 2018-08-26 ENCOUNTER — Ambulatory Visit (INDEPENDENT_AMBULATORY_CARE_PROVIDER_SITE_OTHER): Payer: Self-pay | Admitting: Family Medicine

## 2018-08-26 ENCOUNTER — Encounter (HOSPITAL_BASED_OUTPATIENT_CLINIC_OR_DEPARTMENT_OTHER): Payer: Self-pay | Admitting: Emergency Medicine

## 2018-08-26 ENCOUNTER — Emergency Department (HOSPITAL_COMMUNITY): Payer: No Typology Code available for payment source

## 2018-08-26 ENCOUNTER — Other Ambulatory Visit: Payer: Self-pay

## 2018-08-26 ENCOUNTER — Emergency Department (HOSPITAL_BASED_OUTPATIENT_CLINIC_OR_DEPARTMENT_OTHER): Payer: No Typology Code available for payment source

## 2018-08-26 ENCOUNTER — Emergency Department (HOSPITAL_BASED_OUTPATIENT_CLINIC_OR_DEPARTMENT_OTHER)
Admission: EM | Admit: 2018-08-26 | Discharge: 2018-08-26 | Disposition: A | Discharge status: Home / Self Care | Payer: No Typology Code available for payment source | Attending: Emergency Medicine | Admitting: Emergency Medicine

## 2018-08-26 VITALS — BP 115/80 | HR 112 | Temp 98.1°F | Resp 16 | Wt 138.8 lb

## 2018-08-26 DIAGNOSIS — R42 Dizziness and giddiness: Secondary | ICD-10-CM | POA: Diagnosis not present

## 2018-08-26 DIAGNOSIS — R519 Headache, unspecified: Secondary | ICD-10-CM

## 2018-08-26 DIAGNOSIS — R51 Headache: Secondary | ICD-10-CM | POA: Diagnosis present

## 2018-08-26 DIAGNOSIS — I7774 Dissection of vertebral artery: Secondary | ICD-10-CM | POA: Insufficient documentation

## 2018-08-26 DIAGNOSIS — Z79899 Other long term (current) drug therapy: Secondary | ICD-10-CM | POA: Insufficient documentation

## 2018-08-26 DIAGNOSIS — H539 Unspecified visual disturbance: Secondary | ICD-10-CM

## 2018-08-26 DIAGNOSIS — J189 Pneumonia, unspecified organism: Secondary | ICD-10-CM

## 2018-08-26 DIAGNOSIS — M542 Cervicalgia: Secondary | ICD-10-CM

## 2018-08-26 LAB — CBC WITH DIFFERENTIAL/PLATELET
Abs Immature Granulocytes: 0.09 10*3/uL — ABNORMAL HIGH (ref 0.00–0.07)
Basophils Absolute: 0.1 10*3/uL (ref 0.0–0.1)
Basophils Relative: 1 %
Eosinophils Absolute: 0.2 10*3/uL (ref 0.0–0.5)
Eosinophils Relative: 3 %
HEMATOCRIT: 45.1 % (ref 36.0–46.0)
Hemoglobin: 14.1 g/dL (ref 12.0–15.0)
Immature Granulocytes: 1 %
Lymphocytes Relative: 34 %
Lymphs Abs: 2.7 10*3/uL (ref 0.7–4.0)
MCH: 27.3 pg (ref 26.0–34.0)
MCHC: 31.3 g/dL (ref 30.0–36.0)
MCV: 87.4 fL (ref 80.0–100.0)
Monocytes Absolute: 0.5 10*3/uL (ref 0.1–1.0)
Monocytes Relative: 7 %
NEUTROS PCT: 54 %
Neutro Abs: 4.4 10*3/uL (ref 1.7–7.7)
Platelets: 611 10*3/uL — ABNORMAL HIGH (ref 150–400)
RBC: 5.16 MIL/uL — ABNORMAL HIGH (ref 3.87–5.11)
RDW: 13.3 % (ref 11.5–15.5)
WBC: 8 10*3/uL (ref 4.0–10.5)
nRBC: 0 % (ref 0.0–0.2)

## 2018-08-26 LAB — COMPREHENSIVE METABOLIC PANEL
ALT: 42 U/L (ref 0–44)
AST: 33 U/L (ref 15–41)
Albumin: 3.7 g/dL (ref 3.5–5.0)
Alkaline Phosphatase: 59 U/L (ref 38–126)
Anion gap: 8 (ref 5–15)
BUN: 12 mg/dL (ref 6–20)
CO2: 26 mmol/L (ref 22–32)
CREATININE: 0.78 mg/dL (ref 0.44–1.00)
Calcium: 9.4 mg/dL (ref 8.9–10.3)
Chloride: 101 mmol/L (ref 98–111)
GFR calc Af Amer: >60 mL/min (ref >60)
GFR calc non Af Amer: >60 mL/min (ref >60)
Glucose, Bld: 102 mg/dL — ABNORMAL HIGH (ref 70–99)
Potassium: 3.7 mmol/L (ref 3.5–5.1)
Sodium: 135 mmol/L (ref 135–145)
Total Bilirubin: 0.4 mg/dL (ref 0.3–1.2)
Total Protein: 8 g/dL (ref 6.5–8.1)

## 2018-08-26 LAB — PROTIME-INR
INR: 0.92
Prothrombin Time: 12.3 seconds (ref 11.4–15.2)

## 2018-08-26 LAB — I-STAT CG4 LACTIC ACID, ED: Lactic Acid, Venous: 1.52 mmol/L (ref 0.5–1.9)

## 2018-08-26 MED ORDER — GADOBUTROL 1 MMOL/ML IV SOLN
6.0000 mL | Freq: Once | INTRAVENOUS | Status: AC | PRN
  Administered 2018-08-26: 6 mL via INTRAVENOUS

## 2018-08-26 MED ORDER — LEVETIRACETAM IN NACL 1000 MG/100ML IV SOLN: 1000.0000 mg | Freq: Once | INTRAVENOUS | Status: DC

## 2018-08-26 MED ORDER — DOXYCYCLINE HYCLATE 100 MG PO CAPS: 100.0000 mg | ORAL_CAPSULE | Freq: Two times a day (BID) | ORAL | 0 refills | Status: DC

## 2018-08-26 MED ORDER — TRAMADOL HCL 50 MG PO TABS
50.0000 mg | ORAL_TABLET | Freq: Once | ORAL | Status: AC
  Administered 2018-08-26: 50 mg via ORAL
  Filled 2018-08-26: qty 1

## 2018-08-26 MED ORDER — IOPAMIDOL (ISOVUE-370) INJECTION 76%
100.0000 mL | Freq: Once | INTRAVENOUS | Status: AC | PRN
  Administered 2018-08-26: 100 mL via INTRAVENOUS

## 2018-08-26 MED ORDER — KETOROLAC TROMETHAMINE 30 MG/ML IJ SOLN
15.0000 mg | Freq: Once | INTRAMUSCULAR | Status: AC
  Administered 2018-08-26: 15 mg via INTRAVENOUS
  Filled 2018-08-26: qty 1

## 2018-08-26 MED ORDER — DIPHENHYDRAMINE HCL 50 MG/ML IJ SOLN
12.5000 mg | Freq: Once | INTRAMUSCULAR | Status: AC
  Administered 2018-08-26: 12.5 mg via INTRAVENOUS
  Filled 2018-08-26: qty 1

## 2018-08-26 MED ORDER — OXYCODONE HCL 5 MG PO TABS: 2.5000 mg | ORAL_TABLET | Freq: Four times a day (QID) | ORAL | 0 refills | Status: DC | PRN

## 2018-08-26 MED ORDER — PROCHLORPERAZINE EDISYLATE 10 MG/2ML IJ SOLN
5.0000 mg | Freq: Once | INTRAMUSCULAR | Status: AC
  Administered 2018-08-26: 5 mg via INTRAVENOUS
  Filled 2018-08-26: qty 2

## 2018-08-26 NOTE — Patient Instructions (Signed)
I am referring you to the ER for further work-up and evaluation given these symptoms have remained present for greater than 3 days and have not responded to Toradol which is all the you could be provided with here today for headache management. I recommend further evaluation at [facility] or [facility] as you may require further work-up and imaging.

## 2018-08-26 NOTE — Discharge Instructions (Addendum)
Take a full dose aspirin daily Take 81 mg aspirin daily.  Follow up with Dr. Estanislado Pandy for IR angiogram this coming Monday or Tuesday. Get help right away if: You feel weak or dizzy. You have a sudden, severe headache with no known cause. You notice changes in your vision or speech. You have difficulty breathing. You have a loss of balance or coordination. You have numbness in your face, arm, or leg. You have chest pain. You have a fever.

## 2018-08-26 NOTE — ED Notes (Addendum)
To mri 

## 2018-08-26 NOTE — ED Provider Notes (Signed)
Patient with R sided Headache/ neck pain. 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 MRI pending. Dr. Rory Percy involved.  Patient MRI shows vertebral artery dissection.  She will definitively need an angiogram which may be done outpatient according to Dr. Lorraine Lax. She will also need to be on asa daily.  Patient will be given pain meds for headache and also will have doxy for her CAP. She will need close follow up with Dr. Estanislado Pandy of neuro IR.    Margarita Mail, PA-C 08/26/18 2342    Little, Wenda Overland, MD 09/01/18 1125

## 2018-08-26 NOTE — ED Notes (Signed)
Patient transported to CT 

## 2018-08-26 NOTE — ED Notes (Signed)
Pt back from CT

## 2018-08-26 NOTE — ED Notes (Signed)
Family at  The bedside 

## 2018-08-26 NOTE — Progress Notes (Signed)
Patient is being referred to the ER for further work-up and evaluation.  Reviewed chart and patient has had a headache for over the last 6 days which is not improved with Toradol, triptans, Tylenol, or cyclobenzaprine.  Symptoms appear to be worsened today as she is having some right-sided neck stiffness and intense eye pressure behind the right eye which occasionally is accompanied by shooting pain radiating up the back of her right side of her head.  She is sustained no head injuries within the last 2 weeks.  She is not having nausea, vomiting, sensitivity to light although notes some intermittent blurring of vision.  Given symptoms I am recommending for patient to go immediately to the ER for further work-up and evaluation including a CT study.  Patient has been in contact with primary care provider who also advised that she could order a CT over the weekend to have patient evaluated.  Given co-pay at ER patient is opting to contact PCP prior to going to the ER to see if CT could be ordered stat.  I advised if unable to contact PCP to go to the ER for further work-up as I do not feel she should widen additional 24 hours to be evaluated given symptoms have not remitted or improved within 6 days.  Brief exam findings: Neck: rigidity noted along right trapezius muscle.  Tenderness with palpation and rotational ROM Eyes: bilateral eyes appear  Neurological: alert, oriented, gait intact, coherent speech.   Respiratory: Respirations even and non-labored Cardiovascular: tachycardia  Assessment/Plan: Deferred to ER for further work-up and evaluation. Patient verbalized agreement and understanding of plan.     Carroll Sage. Kenton Kingfisher, MSN, FNP-C [address] # [place]  [place], [place] [address] [phone]

## 2018-08-26 NOTE — ED Triage Notes (Signed)
Recently treated for pneumonia. Developed headache 1 week ago which developed to neck pain and tightness on Thursday. Saw PCP on Friday for labs. All meds given provided no relief. Headache persists.

## 2018-08-26 NOTE — ED Notes (Signed)
neujrology at   The bedside

## 2018-08-26 NOTE — Consult Note (Signed)
Neurology Consultation  Reason for Consult: right vertebral artery dissection Referring Physician: Dr Sherry Ruffing, Dr Rex Kras  CC: Unremitting headache, abnormal CTA head/neck   History is obtained from: Patient, chart  HPI: Melissa Owens is a 37 y.o. female past medical history of migraines, depression, hypercholesterolemia, who was in his usual state of health until Tuesday, 08/21/2018 when she started having some headaches and neck discomfort.  She describes the preceding pneumonia for which she is being treated and had a bout of cough on Thursday on the headache became worse and the neck pain started to happen.  She is tender on the right side of her neck.  She also describes occasional blurred vision.  She has a history of migraines but this is different than her migraines.  This is more of a pain and pressure behind her right eye. She presented to [facility] for evaluation.  A CTA of the head and neck was done because of the association of this neck pain that started after a bout of cough and concern for dissection. The CTA was read as being not so much of the right vertebral artery dissection but more so an eccentric thrombus in the right vert V3 segment. She was sent to [facility] for further evaluation and work-up.  LKW: Tuesday, 08/21/2018 at some point tpa given?: no, outside window Premorbid modified Rankin scale (mRS):0  ROS: ROS was performed and is negative except as noted in the HPI.   Past Medical History:  Diagnosis Date  . Chicken pox   . Exercise-induced asthma 11/21/2017  . High cholesterol   . Migraines   . Urinary tract infection     Family History  Problem Relation Age of Onset  . Hypertension Mother   . High Cholesterol Mother   . Cancer Father   . Heart attack Father   . Heart disease Father   . Arthritis Maternal Grandmother   . Cancer Maternal Grandmother   . High Cholesterol Maternal Grandmother   . High blood pressure Maternal  Grandmother   . Stroke Maternal Grandmother   . Arthritis Maternal Grandfather   . Diabetes Maternal Grandfather   . Hearing loss Maternal Grandfather   . High Cholesterol Maternal Grandfather   . High blood pressure Maternal Grandfather   . Asthma Paternal Grandmother   . Diabetes Paternal Grandmother   . Miscarriages / Stillbirths Paternal Grandmother   . COPD Paternal Grandfather   . Alcohol abuse Brother   . Drug abuse Brother     Social History:   reports that she has never smoked. She has never used smokeless tobacco. Her alcohol and drug histories are not on file.  Medications  Current Facility-Administered Medications:  .  ibuprofen (ADVIL,MOTRIN) tablet 600 mg, 600 mg, Oral, Once, Copland, Jessica C, MD  Current Outpatient Medications:  .  albuterol (PROVENTIL HFA;VENTOLIN HFA) 108 (90 Base) MCG/ACT inhaler, Inhale 2 puffs into the lungs as needed. Inhale two puffs into the lungs every 6 hours as needed., Disp: , Rfl:  .  azithromycin (ZITHROMAX) 250 MG tablet, Use as a zpack (Patient not taking: Reported on 08/26/2018), Disp: 6 tablet, Rfl: 0 .  buPROPion (WELLBUTRIN XL) 300 MG 24 hr tablet, Take 1 tablet by mouth daily, Disp: 90 tablet, Rfl: 3 .  cyclobenzaprine (FLEXERIL) 10 MG tablet, Take 0.5-1 tablets (5-10 mg total) by mouth 3 (three) times daily as needed for muscle spasms., Disp: 30 tablet, Rfl: 0 .  Multiple Vitamins-Minerals (WOMENS MULTIVITAMIN PO),  Take 1 tablet by mouth daily., Disp: , Rfl:  .  norethindrone-ethinyl estradiol (MICROGESTIN,JUNEL,LOESTRIN) 1-20 MG-MCG tablet, Take 1 tablet daily for 21 days per month, Disp: , Rfl: 4  Exam: Current vital signs: BP (!) 154/109   Pulse 86   Temp 98.8 F (37.1 C)   Resp 16   Ht 5\' 2"  (1.575 m)   Wt 62.6 kg   LMP 07/30/2018 (Approximate) Comment: ncp  SpO2 98%   BMI 25.24 kg/m  Vital signs in last 24 hours: Temp:  [98.1 F (36.7 C)-98.8 F (37.1 C)] 98.8 F (37.1 C) (12/08 1747) Pulse Rate:  [86-112] 86  (12/08 1500) Resp:  [12-21] 16 (12/08 1747) BP: (115-154)/(80-109) 154/109 (12/08 1735) SpO2:  [97 %-100 %] 98 % (12/08 1500) Weight:  [62.6 kg-63 kg] 62.6 kg (12/08 1333) GENERAL: Awake, alert in NAD HEENT: - Normocephalic and atraumatic, dry mm, no LN++, no Thyromegally LUNGS - Clear to auscultation bilaterally with no wheezes CV - S1S2 RRR, no m/r/g, equal pulses bilaterally. ABDOMEN - Soft, nontender, nondistended with normoactive BS Ext: warm, well perfused, intact peripheral pulses, no edema NEURO:  Mental Status: AA&Ox3 Language: speech is clear.  Naming, repetition, fluency, and comprehension intact. Cranial Nerves: PERRL.  No ptosis.  EOMI, visual fields full, no facial asymmetry, facial sensation intact, hearing intact, tongue/uvula/soft palate midline, normal sternocleidomastoid and trapezius muscle strength. No evidence of tongue atrophy or fibrillations Motor: 5/5 all over with no drift Tone: is normal and bulk is normal Sensation- Intact to light touch bilaterally Coordination: FTN intact bilaterally, no ataxia in BLE. Gait- deferred  NIHSS-0  Labs I have reviewed labs in epic and the results pertinent to this consultation are: CBC    Component Value Date/Time   WBC 8.0 08/26/2018 1350   RBC 5.16 (H) 08/26/2018 1350   HGB 14.1 08/26/2018 1350   HCT 45.1 08/26/2018 1350   PLT 611 (H) 08/26/2018 1350   MCV 87.4 08/26/2018 1350   MCH 27.3 08/26/2018 1350   MCHC 31.3 08/26/2018 1350   RDW 13.3 08/26/2018 1350   LYMPHSABS 2.7 08/26/2018 1350   MONOABS 0.5 08/26/2018 1350   EOSABS 0.2 08/26/2018 1350   BASOSABS 0.1 08/26/2018 1350   CMP     Component Value Date/Time   NA 135 08/26/2018 1350   NA 139 03/23/2017   K 3.7 08/26/2018 1350   CL 101 08/26/2018 1350   CO2 26 08/26/2018 1350   GLUCOSE 102 (H) 08/26/2018 1350   BUN 12 08/26/2018 1350   BUN 9 03/23/2017   CREATININE 0.78 08/26/2018 1350   CALCIUM 9.4 08/26/2018 1350   PROT 8.0 08/26/2018 1350    ALBUMIN 3.7 08/26/2018 1350   AST 33 08/26/2018 1350   ALT 42 08/26/2018 1350   ALKPHOS 59 08/26/2018 1350   BILITOT 0.4 08/26/2018 1350   GFRNONAA >60 08/26/2018 1350   GFRAA >60 08/26/2018 1350   Lipid Panel     Component Value Date/Time   CHOL 186 11/30/2017 0815   TRIG 79.0 11/30/2017 0815   HDL 66.40 11/30/2017 0815   CHOLHDL 3 11/30/2017 0815   VLDL 15.8 11/30/2017 0815   LDLCALC 104 (H) 11/30/2017 0815   Imaging I have reviewed the images obtained: CT-scan of the brain- no acute changes. CTA head/neck:  CTA neck: IMPRESSION 1. 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 Findings probably represent an intramural hematoma/focal dissection. Additionally, there is asymmetric enhancement of the surrounding venous  plexus centered around the vessel injury. 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 2. Normal appearance of carotid systems in left vertebral artery. 3. Left lower lobe superior segment pneumonia.  Assessment:  37 year old woman past history of migraines headache depression hypercholesterolemia presenting with headache and neck pain and CT angiogram concerning for right vertebral artery V3 segment eccentric lateral wall thickening-hematoma versus dissection and asymmetric enhancement of the surrounding venous plexus concerning for AV fistula. She will need further imaging with an MRI and MRA of the neck.  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  Recommendations: MRI brain w/o contrast MRA neck fat sat (ordered after discussion w/neurorads). If the MRA fat-sat of the neck rules out any vascular malformation, she can be discharged home. In the absence of a definitive answer from the MRI/MRA neck, she will need a diagnostic cerebral angiogram. The decision to keep  her in-house and do the diagnostic angiogram tomorrow versus discharge with outpatient IR appt with Dr. Estanislado Pandy for a diagnostic cerebral angiogram sometime in the next week as an outpatient will be based on the MRI and MRA results.  If she has any evidence of strokes, she would require admission.  If there are no strokes, work-up can be done as an outpatient as well.  I will sign out her care to my oncoming partner overnight-Dr. Lorraine Lax, who will follow up on these results and update the ER team.  -- Amie Portland, MD [facility] Pager: [phone] If 7pm to 7am, please call on call as listed on AMION.

## 2018-08-26 NOTE — ED Notes (Signed)
Pt arrived by [facility]   Headache that nothing is helping  Sent here for an emergency mri after a clean c-t scan of the brain

## 2018-08-26 NOTE — ED Provider Notes (Signed)
[facility] EMERGENCY DEPARTMENT Provider Note   CSN: [id number] Arrival date & time: 08/26/18  1330     History   Chief Complaint Chief Complaint  Patient presents with  . Headache    HPI Melissa Owens is a 37 y.o. female.  The history is provided by the patient, the spouse and medical records. No language interpreter was used.  Headache   This is a new problem. The current episode started more than 2 days ago. The problem occurs constantly. The problem has not changed since onset.Associated with: neck movement. The pain is located in the occipital and right unilateral region. The quality of the pain is described as sharp and dull. The pain is at a severity of 7/10. The pain is moderate. The pain radiates to the right neck. Pertinent negatives include no anorexia, no fever, no malaise/fatigue, no chest pressure, no near-syncope, no palpitations, no syncope, no shortness of breath, no nausea and no vomiting. She has tried NSAIDs, ketorolac injections and acetaminophen for the symptoms. The treatment provided no relief.    Past Medical History:  Diagnosis Date  . Chicken pox   . Exercise-induced asthma 11/21/2017  . High cholesterol   . Migraines   . Urinary tract infection     Patient Active Problem List   Diagnosis Date Noted  . Seasonal affective disorder ([place]) 11/21/2017  . 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 Exercise-induced asthma 11/21/2017    Past Surgical History:  Procedure Laterality Date  . CESAREAN SECTION  2009  . WISDOM TOOTH EXTRACTION       OB History   None      Home Medications    Prior to Admission medications   Medication Sig Start Date End Date Taking? Authorizing Provider  albuterol (PROVENTIL HFA;VENTOLIN HFA) 108 (90 Base) MCG/ACT inhaler Inhale 2 puffs into the lungs as needed. Inhale two puffs into the lungs every 6 hours as needed. 03/23/17 03/23/18  [provider]  azithromycin (ZITHROMAX) 250 MG tablet Use as a  zpack Patient not taking: Reported on 08/26/2018 08/20/18   Copland, Gay Filler, MD  buPROPion (WELLBUTRIN XL) 300 MG 24 hr tablet Take 1 tablet by mouth daily 08/20/18   Copland, Gay Filler, MD  cyclobenzaprine (FLEXERIL) 10 MG tablet Take 0.5-1 tablets (5-10 mg total) by mouth 3 (three) times daily as needed for muscle spasms. 08/24/18   Marrian Salvage, FNP  Multiple Vitamins-Minerals (WOMENS MULTIVITAMIN PO) Take 1 tablet by mouth daily.    [provider]  norethindrone-ethinyl estradiol (MICROGESTIN,JUNEL,LOESTRIN) 1-20 MG-MCG tablet Take 1 tablet daily for 21 days per month 09/20/17   [provider]    Family History Family History  Problem Relation Age of Onset  . Hypertension Mother   . High Cholesterol Mother   . Cancer Father   . Heart attack Father   . Heart disease Father   . Arthritis Maternal Grandmother   . Cancer Maternal Grandmother   . High Cholesterol Maternal Grandmother   . High blood pressure Maternal Grandmother   . Stroke Maternal Grandmother   . Arthritis Maternal Grandfather   . Diabetes Maternal Grandfather   . Hearing loss Maternal Grandfather   . High Cholesterol Maternal Grandfather   . High blood pressure Maternal Grandfather   . Asthma Paternal Grandmother   . Diabetes Paternal Grandmother   . Miscarriages / Stillbirths Paternal Grandmother   . COPD Paternal Grandfather   . Alcohol abuse Brother   . Drug abuse Brother  Social History Social History   Tobacco Use  . Smoking status: Never Smoker  . Smokeless tobacco: Never Used  Substance Use Topics  . Alcohol use: Not on file  . Drug use: Not on file     Allergies   Sulfa antibiotics   Review of Systems Review of Systems  Constitutional: Negative for chills, diaphoresis, fatigue, fever and malaise/fatigue.  HENT: Negative for sore throat.   Eyes: Positive for visual disturbance (transient). Negative for photophobia.  Respiratory: Negative for cough (resolved),  chest tightness, shortness of breath, wheezing and stridor.   Cardiovascular: Negative for chest pain, palpitations, leg swelling, syncope and near-syncope.  Gastrointestinal: Negative for abdominal pain, anorexia, diarrhea, nausea and vomiting.  Genitourinary: Negative for dysuria, flank pain and frequency.  Musculoskeletal: Positive for neck pain and neck stiffness. Negative for back pain.  Skin: Negative for rash and wound.  Neurological: Positive for dizziness and headaches. Negative for light-headedness.  Psychiatric/Behavioral: Negative for agitation and confusion.  All other systems reviewed and are negative.    Physical Exam Updated Vital Signs BP (!) 154/96 (BP Location: Left Arm)   Pulse 92   Temp 98.2 F (36.8 C) (Oral)   Resp 18   Ht 5\' 2"  (1.575 m)   Wt 62.6 kg   LMP 07/30/2018 (Approximate) Comment: ncp  SpO2 100%   BMI 25.24 kg/m   Physical Exam  Constitutional: She is oriented to person, place, and time. She appears well-developed and well-nourished.  Non-toxic appearance. She does not appear ill. No distress.  HENT:  Head: Normocephalic and atraumatic.  Nose: Nose normal.  Mouth/Throat: Oropharynx is clear and moist. No oropharyngeal exudate.  Eyes: Pupils are equal, round, and reactive to light. Conjunctivae and EOM are normal.  Neck: Muscular tenderness present. No spinous process tenderness present. Neck rigidity (with lateral turning to left) present. Decreased range of motion present. No edema and no erythema present.    Cardiovascular: Normal rate, regular rhythm and intact distal pulses.  No murmur heard. Pulmonary/Chest: Effort normal and breath sounds normal. No respiratory distress. She has no wheezes. She has no rales. She exhibits no tenderness.  Abdominal: Soft. There is no tenderness.  Musculoskeletal: She exhibits tenderness. She exhibits no edema.  Neurological: She is alert and oriented to person, place, and time. No cranial nerve deficit or  sensory deficit. She exhibits normal muscle tone. Coordination normal.  Skin: Skin is warm and dry. Capillary refill takes less than 2 seconds. No rash noted. She is not diaphoretic. No erythema.  Psychiatric: She has a normal mood and affect.  Nursing note and vitals reviewed.    ED Treatments / Results  Labs (all labs ordered are listed, but only abnormal results are displayed) Labs Reviewed  CBC WITH DIFFERENTIAL/PLATELET - Abnormal; Notable for the following components:      Result Value   RBC 5.16 (*)    Platelets 611 (*)    Abs Immature Granulocytes 0.09 (*)    All other components within normal limits  COMPREHENSIVE METABOLIC PANEL - Abnormal; Notable for the following components:   Glucose, Bld 102 (*)    All other components within normal limits  PROTIME-INR  I-STAT CG4 LACTIC ACID, ED  I-STAT CG4 LACTIC ACID, ED    EKG None  Radiology Ct Angio Head W Or Wo Contrast  Result Date: 08/26/2018 CLINICAL DATA:  37 y/o F; right-sided head and neck pain for 1 week. EXAM: CT ANGIOGRAPHY HEAD AND NECK TECHNIQUE: Multidetector CT imaging of the head and  neck was performed using the standard protocol during bolus administration of intravenous contrast. Multiplanar CT image reconstructions and MIPs were obtained to evaluate the vascular anatomy. Carotid stenosis measurements (when applicable) are obtained utilizing NASCET criteria, using the distal internal carotid diameter as the denominator. CONTRAST:  161mL ISOVUE-370 IOPAMIDOL (ISOVUE-370) INJECTION 76% COMPARISON:  None. FINDINGS: CT HEAD FINDINGS Brain: No evidence of acute infarction, hemorrhage, hydrocephalus, extra-axial collection or mass lesion/mass effect. Vascular: As below. Skull: Normal. Negative for fracture or focal lesion. Sinuses: Left maxillary sinus mucous retention cyst. Additional visible paranasal sinuses and the mastoid air cells are normally aerated. Orbits: No acute finding. Review of the MIP images confirms the  above findings CTA NECK FINDINGS Aortic arch: Standard branching. Imaged portion shows no evidence of aneurysm or dissection. No significant stenosis of the major arch vessel origins. Right carotid system: No evidence of dissection, stenosis (50% or greater) or occlusion. Left carotid system: No evidence of dissection, stenosis (50% or greater) or occlusion. Vertebral arteries: Moderate stenosis of the right vertebral artery V3 segment just above the C1 arch with eccentric lateral wall thickening (series 12, image 134 and series 19, image 40) and mild irregularity of the vessel lumen extending towards the foramen magnum. No discrete dissection flap. There is asymmetric enhancement of the surrounding venous plexus. The left vertebral artery is widely patent without dissection or aneurysm. Skeleton: Negative Other neck: Left lower lobe superior segment pneumonia. Upper chest: Negative. Review of the MIP images confirms the above findings CTA HEAD FINDINGS Anterior circulation: No significant stenosis, proximal occlusion, aneurysm, or vascular malformation. Posterior circulation: No significant stenosis, proximal occlusion, aneurysm, or vascular malformation. Venous sinuses: As permitted by contrast timing, patent. Anatomic variants: None significant Delayed phase: No abnormal intracranial enhancement. Review of the MIP images confirms the above findings IMPRESSION: CT head: No acute intracranial abnormality identified. Unremarkable CT of the head. CTA neck: 1. 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 Findings probably represent an intramural hematoma/focal dissection. Additionally, there is asymmetric enhancement of the surrounding venous plexus centered around the vessel injury. 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 2. Normal appearance of carotid systems in left vertebral artery. 3. Left lower lobe superior segment  pneumonia. CTA head: Patent anterior and posterior intracranial circulation. No large vessel occlusion, aneurysm, or significant stenosis is identified. These results were called by telephone at the time of interpretation on 08/26/2018 at 3:39 pm to Dr. Marda Stalker , who verbally acknowledged these results. Electronically Signed   By: Kristine Garbe M.D.   On: 08/26/2018 15:56   Ct Angio Neck W And/or Wo Contrast  Result Date: 08/26/2018 CLINICAL DATA:  37 y/o F; right-sided head and neck pain for 1 week. EXAM: CT ANGIOGRAPHY HEAD AND NECK TECHNIQUE: Multidetector CT imaging of the head and neck was performed using the standard protocol during bolus administration of intravenous contrast. Multiplanar CT image reconstructions and MIPs were obtained to evaluate the vascular anatomy. Carotid stenosis measurements (when applicable) are obtained utilizing NASCET criteria, using the distal internal carotid diameter as the denominator. CONTRAST:  137mL ISOVUE-370 IOPAMIDOL (ISOVUE-370) INJECTION 76% COMPARISON:  None. FINDINGS: CT HEAD FINDINGS Brain: No evidence of acute infarction, hemorrhage, hydrocephalus, extra-axial collection or mass lesion/mass effect. Vascular: As below. Skull: Normal. Negative for fracture or focal lesion. Sinuses: Left maxillary sinus mucous retention cyst. Additional visible paranasal sinuses and the mastoid air cells are normally aerated. Orbits: No acute finding. Review of the  MIP images confirms the above findings CTA NECK FINDINGS Aortic arch: Standard branching. Imaged portion shows no evidence of aneurysm or dissection. No significant stenosis of the major arch vessel origins. Right carotid system: No evidence of dissection, stenosis (50% or greater) or occlusion. Left carotid system: No evidence of dissection, stenosis (50% or greater) or occlusion. Vertebral arteries: Moderate stenosis of the right vertebral artery V3 segment just above the C1 arch with eccentric  lateral wall thickening (series 12, image 134 and series 19, image 40) and mild irregularity of the vessel lumen extending towards the foramen magnum. No discrete dissection flap. There is asymmetric enhancement of the surrounding venous plexus. The left vertebral artery is widely patent without dissection or aneurysm. Skeleton: Negative Other neck: Left lower lobe superior segment pneumonia. Upper chest: Negative. Review of the MIP images confirms the above findings CTA HEAD FINDINGS Anterior circulation: No significant stenosis, proximal occlusion, aneurysm, or vascular malformation. Posterior circulation: No significant stenosis, proximal occlusion, aneurysm, or vascular malformation. Venous sinuses: As permitted by contrast timing, patent. Anatomic variants: None significant Delayed phase: No abnormal intracranial enhancement. Review of the MIP images confirms the above findings IMPRESSION: CT head: No acute intracranial abnormality identified. Unremarkable CT of the head. CTA neck: 1. 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 Findings probably represent an intramural hematoma/focal dissection. Additionally, there is asymmetric enhancement of the surrounding venous plexus centered around the vessel injury. 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 2. Normal appearance of carotid systems in left vertebral artery. 3. Left lower lobe superior segment pneumonia. CTA head: Patent anterior and posterior intracranial circulation. No large vessel occlusion, aneurysm, or significant stenosis is identified. These results were called by telephone at the time of interpretation on 08/26/2018 at 3:39 pm to Dr. Marda Stalker , who verbally acknowledged these results. Electronically Signed   By: Kristine Garbe M.D.   On: 08/26/2018 15:56    Procedures Procedures (including critical care time)  CRITICAL  CARE Performed by: Gwenyth Allegra Bernadene Garside Total critical care time: 50 minutes Critical care time was exclusive of separately billable procedures and treating other patients. Critical care was necessary to treat or prevent imminent or life-threatening deterioration. Critical care was time spent personally by me on the following activities: development of treatment plan with patient and/or surrogate as well as nursing, discussions with consultants, evaluation of patient's response to treatment, examination of patient, obtaining history from patient or surrogate, ordering and performing treatments and interventions, ordering and review of laboratory studies, ordering and review of radiographic studies, pulse oximetry and re-evaluation of patient's condition.   Medications Ordered in ED Medications  traMADol (ULTRAM) tablet 50 mg (has no administration in time range)  iopamidol (ISOVUE-370) 76 % injection 100 mL (100 mLs Intravenous Contrast Given 08/26/18 1509)     Initial Impression / Assessment and Plan / ED Course  I have reviewed the triage vital signs and the nursing notes.  Pertinent labs & imaging results that were available during my care of the patient were reviewed by me and considered in my medical decision making (see chart for details).  Clinical Course as of Aug 26 1621  Sun Aug 26, 2018  1619 ALT: 42 [CT]    Clinical Course User Index [CT] Trayce Maino, Gwenyth Allegra, MD    Melissa Owens is a 37 y.o. female with a past medical history significant for exercise-induced asthma, hypercholesterolemia, seasonal affective disorder, prior migraines, and recent diagnosis  of pneumonia who presents with severe headache and neck pain.  Patient was instructed to come in by her primary doctor for further evaluation of head and neck pain.  According to patient, husband, and provider, patient was diagnosed with pneumonia this past Monday after having a week of significant cough.  She took 10 flu for  several days before as she thought that it was influenza however imaging with PCP revealed pneumonia.  Patient was treated with a Z-Pak however she continued to have severe coughing spells.  She reports that during her coughing fits she developed severe pain in her right occiput and right neck.  She also reports pain behind her right eye.  She reports that this feels different than prior migraines.  She reports that she has tried numerous different medications including muscle relaxants without significant headache relief.  She reports her pain is currently a 7 out of 10 in severity.  She reports transient dizziness yesterday and today.  She reports some bilateral blurry vision that comes and goes.  She reports the pain is severely worsened when she tries to turn her head to the left but is not is worse when turning to the right.  She also reports that there is some minimal discomfort when she flexes her neck but not as severe.  She denies any trauma.  She reports that over the last several days her fever has resolved, her cough is improved and she has not had any other nausea or vomiting.  Patient otherwise is feeling well aside from the headache.  Patient was sent to the emergency department for further evaluation and to rule out other intracranial normality.  On exam, patient was found to have tenderness in the right superior neck.  Patient also has a tenderness in the right occiput.  Patient had significant pain with twisting her neck to the left but had minimal rigidity when bending forward or to the right.  No other focal neurologic deficits.  Patient had normal pupils with normal extraocular movements.  No photophobia.  Normal sensation, strength, and coordination with finger-nose-finger testing.  Lungs were clear and chest was nontender.  No bruit was appreciated on exam.  No midline neck tenderness.  Clinically I am most concerned about a vertebral artery dissection in the setting of coughing fits  from her pneumonia.  I feel that her infection has continued to improve as she is having no fevers, chills, congestion or cough persistently.  Although patient recently had infection and has headache with neck pain/stiffness, I have a lower suspicion for meningitis at this time.  I am most concerned about vertebral artery injury.  Patient will have screening laboratory testing as well as CTA of the head and neck.  Shared decision made conversation held with patient and husband who is a pharmacist as for headache management and they would rather defer until imaging is completed.  If imaging is all reassuring, will consider headache medications as well as consider pursuing lumbar puncture if labs are concerning.  4:10 PM Patient's laboratory testing was overall reassuring.  No leukocytosis and no anemia.  Similar elevated platelets to prior.  Likely acute phase reactant.  Metabolic panel was reassuring INR was normal.  CT of the head neck was concerning in her right vertebral artery.  There was concern for either focal dissection versus intramural hematoma versus traumatic AV fistulization.  Neuroradiology did not feel that there was extravasation of contrast however after speaking with neurology, decision was made to hold on heparin  until they could evaluate patient and perform MRI brain.  MRI brain was ordered and patient be transferred ED to ED to [facility] for neurologic evaluation and further management.  Spoke with Dr. Francia Greaves who accepted the patient in transfer.  Patient will be transferred by [facility].  Patient requested some end up with her pain.  After discussion with her husband and patient, we decided to do an oral tramadol pill.  This was ordered.  Anticipate transfer for further management at [facility].  When patient arrived to [facility], ED, please contact Dr. Rory Percy with neurology for evaluation.  She will also receive MRI to further determine..   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 Vertebral artery dissection ([place])   2. Transient vision disturbance   3. Dizziness   4. Acute intractable headache, unspecified headache type     Disposition: Transfer to [facility] in ED to ED transfer for further imaging and evaluation by neurology specialist.  This note was prepared with assistance of Dragon voice recognition software. Occasional wrong-word or sound-a-like substitutions may have occurred due to the inherent limitations of voice recognition software.     Raghad Lorenz, Gwenyth Allegra, MD 08/26/18 1622

## 2018-08-26 NOTE — Telephone Encounter (Signed)
Pt called me due to worsening HA She was seen by UC today, and they did recommend a CT scan Will order for her stat at [facility] imaging - however it turns out they don't do outpt CT on the weekends any longer.  Also [facility] won't do without a pre-cert.    Called pt and spoke with her and her husband.  They are going to the ER at the [facility] and alerted them

## 2018-08-26 NOTE — ED Notes (Signed)
Pt returned from mri with the same headache alert oriented skin warm an dry  shes c/o being hungry

## 2018-08-26 NOTE — Progress Notes (Addendum)
Case handed to me by Dr. Rory Percy.  MRI brain shows no stroke. MRA neck redomstrates right v3 stenosis with intramural hematoma. ? Dissection vs possible AVF.   Plan Start patient on ASA 81mg  Migraine cocktail F/U with Dr Patrecia Pour for cerebral angiogram as outpatient and Neurology follow up with Dr Leonie Man.   Discussed with patient and her husband regarding the plan.

## 2018-08-27 ENCOUNTER — Other Ambulatory Visit (HOSPITAL_COMMUNITY): Payer: Self-pay | Admitting: Interventional Radiology

## 2018-08-27 DIAGNOSIS — I771 Stricture of artery: Secondary | ICD-10-CM

## 2018-08-27 MED FILL — DOXYCYCLINE HYCLATE 100 MG: 100 | 7 days supply | Qty: 14 | Fill #0

## 2018-08-27 MED FILL — oxyCODONE HCL 5 MG TABS: 5 | 2 days supply | Qty: 8 | Fill #0

## 2018-08-28 ENCOUNTER — Telehealth: Payer: Self-pay | Admitting: Family Medicine

## 2018-08-28 NOTE — Telephone Encounter (Signed)
Called to check in with pt.  She is doing ok, just waiting on her angiogram which is scheduled for Thursday. We hope that this will be helpful

## 2018-08-29 ENCOUNTER — Other Ambulatory Visit: Payer: Self-pay | Admitting: Radiology

## 2018-08-30 ENCOUNTER — Other Ambulatory Visit (HOSPITAL_COMMUNITY): Payer: Self-pay | Admitting: Interventional Radiology

## 2018-08-30 ENCOUNTER — Encounter (HOSPITAL_COMMUNITY): Payer: Self-pay | Admitting: *Deleted

## 2018-08-30 ENCOUNTER — Ambulatory Visit (HOSPITAL_COMMUNITY)
Admission: RE | Admit: 2018-08-30 | Discharge: 2018-08-30 | Disposition: A | Discharge status: Home / Self Care | Payer: No Typology Code available for payment source | Source: Ambulatory Visit | Attending: Interventional Radiology | Admitting: Interventional Radiology

## 2018-08-30 ENCOUNTER — Other Ambulatory Visit: Payer: Self-pay

## 2018-08-30 DIAGNOSIS — I6502 Occlusion and stenosis of left vertebral artery: Secondary | ICD-10-CM | POA: Diagnosis not present

## 2018-08-30 DIAGNOSIS — G43909 Migraine, unspecified, not intractable, without status migrainosus: Secondary | ICD-10-CM | POA: Diagnosis not present

## 2018-08-30 DIAGNOSIS — E7800 Pure hypercholesterolemia, unspecified: Secondary | ICD-10-CM | POA: Insufficient documentation

## 2018-08-30 DIAGNOSIS — I7774 Dissection of vertebral artery: Secondary | ICD-10-CM | POA: Insufficient documentation

## 2018-08-30 DIAGNOSIS — Z7982 Long term (current) use of aspirin: Secondary | ICD-10-CM | POA: Diagnosis not present

## 2018-08-30 DIAGNOSIS — M542 Cervicalgia: Secondary | ICD-10-CM | POA: Insufficient documentation

## 2018-08-30 DIAGNOSIS — I771 Stricture of artery: Secondary | ICD-10-CM

## 2018-08-30 HISTORY — PX: IR ANGIO VERTEBRAL SEL VERTEBRAL BILAT MOD SED: IMG5369

## 2018-08-30 HISTORY — PX: IR ANGIO INTRA EXTRACRAN SEL COM CAROTID INNOMINATE BILAT MOD SED: IMG5360

## 2018-08-30 LAB — PREGNANCY, URINE: Preg Test, Ur: NEGATIVE

## 2018-08-30 MED ORDER — MIDAZOLAM HCL 2 MG/2ML IJ SOLN
INTRAMUSCULAR | Status: AC | PRN
  Administered 2018-08-30: 1 mg via INTRAVENOUS

## 2018-08-30 MED ORDER — VERAPAMIL HCL 2.5 MG/ML IV SOLN
INTRAVENOUS | Status: AC
  Filled 2018-08-30: qty 2

## 2018-08-30 MED ORDER — HEPARIN SODIUM (PORCINE) 1000 UNIT/ML IJ SOLN
INTRAMUSCULAR | Status: AC
  Filled 2018-08-30: qty 1

## 2018-08-30 MED ORDER — MIDAZOLAM HCL 2 MG/2ML IJ SOLN
INTRAMUSCULAR | Status: AC
  Filled 2018-08-30: qty 2

## 2018-08-30 MED ORDER — SODIUM CHLORIDE 0.9 % IV SOLN: INTRAVENOUS | Status: AC

## 2018-08-30 MED ORDER — NITROGLYCERIN 1 MG/10 ML FOR IR/CATH LAB
INTRA_ARTERIAL | Status: AC
  Filled 2018-08-30: qty 10

## 2018-08-30 MED ORDER — IOPAMIDOL (ISOVUE-300) INJECTION 61%
INTRAVENOUS | Status: AC
  Filled 2018-08-30: qty 50

## 2018-08-30 MED ORDER — ACETAMINOPHEN 325 MG PO TABS
650.0000 mg | ORAL_TABLET | Freq: Four times a day (QID) | ORAL | Status: DC | PRN
  Administered 2018-08-30: 650 mg via ORAL
  Filled 2018-08-30: qty 2

## 2018-08-30 MED ORDER — HEPARIN SODIUM (PORCINE) 1000 UNIT/ML IJ SOLN
INTRAMUSCULAR | Status: AC | PRN
  Administered 2018-08-30: 1000 [IU] via INTRAVENOUS

## 2018-08-30 MED ORDER — FENTANYL CITRATE (PF) 100 MCG/2ML IJ SOLN
INTRAMUSCULAR | Status: AC | PRN
  Administered 2018-08-30: 25 ug via INTRAVENOUS

## 2018-08-30 MED ORDER — LIDOCAINE HCL 1 % IJ SOLN
INTRAMUSCULAR | Status: AC
  Filled 2018-08-30: qty 20

## 2018-08-30 MED ORDER — LIDOCAINE-PRILOCAINE 2.5-2.5 % EX CREA
TOPICAL_CREAM | Freq: Once | CUTANEOUS | Status: AC
  Administered 2018-08-30: 1 via TOPICAL
  Filled 2018-08-30: qty 5

## 2018-08-30 MED ORDER — SODIUM CHLORIDE 0.9 % IV SOLN
INTRAVENOUS | Status: DC
  Administered 2018-08-30: 10:00:00 via INTRAVENOUS

## 2018-08-30 MED ORDER — IOHEXOL 300 MG/ML  SOLN: 65.0000 mL | Freq: Once | INTRAMUSCULAR | Status: DC | PRN

## 2018-08-30 MED ORDER — FENTANYL CITRATE (PF) 100 MCG/2ML IJ SOLN
INTRAMUSCULAR | Status: AC
  Filled 2018-08-30: qty 2

## 2018-08-30 NOTE — Discharge Instructions (Signed)
Cerebral Angiogram, Care After °Refer to this sheet in the next few weeks. These instructions provide you with information on caring for yourself after your procedure. Your health care provider may also give you more specific instructions. Your treatment has been planned according to current medical practices, but problems sometimes occur. Call your health care provider if you have any problems or questions after your procedure. °What can I expect after the procedure? °After your procedure, it is typical to have the following: °· Bruising at the catheter insertion site that usually fades within 1-2 weeks. °· Blood collecting in the tissue (hematoma) that may be painful to the touch. It should usually decrease in size and tenderness within 1-2 weeks. °· A mild headache. ° °Follow these instructions at home: °· Take medicines only as directed by your health care provider. °· You may shower 24-48 hours after the procedure or as directed by your health care provider. Remove the bandage (dressing) and gently wash the site with plain soap and water. Pat the area dry with a clean towel. Do not rub the site, because this may cause bleeding. °· Do not take baths, swim, or use a hot tub until your health care provider approves. °· Check your insertion site every day for redness, swelling, or drainage. °· Do not apply powder or lotion to the site. °· Do not lift over 10 lb (4.5 kg) for 5 days after your procedure or as directed by your health care provider. °· Ask your health care provider when it is okay to: °? Return to work or school. °? Resume usual physical activities or sports. °? Resume sexual activity. °· Do not drive home if you are discharged the same day as the procedure. Have someone else drive you. °· You may drive 24 hours after the procedure unless otherwise instructed by your health care provider. °· Do not operate machinery or power tools for 24 hours after the procedure or as directed by your health care  provider. °· If your procedure was done as an outpatient procedure, which means that you went home the same day as your procedure, a responsible adult should be with you for the first 24 hours after you arrive home. °· Keep all follow-up visits as directed by your health care provider. This is important. °Contact a health care provider if: °· You have a fever. °· You have chills. °· You have increased bleeding from the catheter insertion site. Hold pressure on the site. °Get help right away if: °· You have vision changes or loss of vision. °· You have numbness or weakness on one side of your body. °· You have difficulty talking, or you have slurred speech or cannot speak (aphasia). °· You feel confused or have difficulty remembering. °· You have unusual pain at the catheter insertion site. °· You have redness, warmth, or swelling at the catheter insertion site. °· You have drainage (other than a small amount of blood on the dressing) from the catheter insertion site. °· The catheter insertion site is bleeding, and the bleeding does not stop after 30 minutes of holding steady pressure on the site. °These symptoms may represent a serious problem that is an emergency. Do not wait to see if the symptoms will go away. Get medical help right away. Call your local emergency services (911 in [place] Do not drive yourself to the hospital. °This information is not intended to replace advice given to you by your health care provider. Make sure you discuss any questions   you have with your health care provider. Document Released: 01/20/2014 Document Revised: 02/11/2016 Document Reviewed: 09/18/2013 Elsevier Interactive Patient Education  2017 [employer].  Moderate Conscious Sedation, Adult, Care After These instructions provide you with information about caring for yourself after your procedure. Your health care provider may also give you more specific instructions. Your treatment has been planned according to current  medical practices, but problems sometimes occur. Call your health care provider if you have any problems or questions after your procedure. What can I expect after the procedure? After your procedure, it is common:  To feel sleepy for several hours.  To feel clumsy and have poor balance for several hours.  To have poor judgment for several hours.  To vomit if you eat too soon.  Follow these instructions at home: For at least 24 hours after the procedure:   Do not: ? Participate in activities where you could fall or become injured. ? Drive. ? Use heavy machinery. ? Drink alcohol. ? Take sleeping pills or medicines that cause drowsiness. ? Make important decisions or sign legal documents. ? Take care of children on your own.  Rest. Eating and drinking  Follow the diet recommended by your health care provider.  If you vomit: ? Drink water, juice, or soup when you can drink without vomiting. ? Make sure you have little or no nausea before eating solid foods. General instructions  Have a responsible adult stay with you until you are awake and alert.  Take over-the-counter and prescription medicines only as told by your health care provider.  If you smoke, do not smoke without supervision.  Keep all follow-up visits as told by your health care provider. This is important. Contact a health care provider if:  You keep feeling nauseous or you keep vomiting.  You feel light-headed.  You develop a rash.  You have a fever. Get help right away if:  You have trouble breathing. This information is not intended to replace advice given to you by your health care provider. Make sure you discuss any questions you have with your health care provider. Document Released: 06/26/2013 Document Revised: 02/08/2016 Document Reviewed: 12/26/2015 Elsevier Interactive Patient Education  2018 [place] Refer to this sheet in the next few weeks. These instructions  provide you with information about caring for yourself after your procedure. Your health care provider may also give you more specific instructions. Your treatment has been planned according to current medical practices, but problems sometimes occur. Call your health care provider if you have any problems or questions after your procedure. What can I expect after the procedure? After your procedure, it is typical to have the following:  Bruising at the radial site that usually fades within 1-2 weeks.  Blood collecting in the tissue (hematoma) that may be painful to the touch. It should usually decrease in size and tenderness within 1-2 weeks.  Follow these instructions at home:  Take medicines only as directed by your health care provider.  You may shower 24-48 hours after the procedure or as directed by your health care provider. Remove the bandage (dressing) and gently wash the site with plain soap and water. Pat the area dry with a clean towel. Do not rub the site, because this may cause bleeding.  Do not take baths, swim, or use a hot tub until your health care provider approves.  Check your insertion site every day for redness, swelling, or drainage.  Do not apply powder or lotion  to the site.  Do not flex or bend the affected arm for 24 hours or as directed by your health care provider.  Do not push or pull heavy objects with the affected arm for 24 hours or as directed by your health care provider.  Do not lift over 10 lb (4.5 kg) for 5 days after your procedure or as directed by your health care provider.  Ask your health care provider when it is okay to: ? Return to work or school. ? Resume usual physical activities or sports. ? Resume sexual activity.  Do not drive home if you are discharged the same day as the procedure. Have someone else drive you.  You may drive 24 hours after the procedure unless otherwise instructed by your health care provider.  Do not operate  machinery or power tools for 24 hours after the procedure.  If your procedure was done as an outpatient procedure, which means that you went home the same day as your procedure, a responsible adult should be with you for the first 24 hours after you arrive home.  Keep all follow-up visits as directed by your health care provider. This is important. Contact a health care provider if:  You have a fever.  You have chills.  You have increased bleeding from the radial site. Hold pressure on the site. Get help right away if:  You have unusual pain at the radial site.  You have redness, warmth, or swelling at the radial site.  You have drainage (other than a small amount of blood on the dressing) from the radial site.  The radial site is bleeding, and the bleeding does not stop after 30 minutes of holding steady pressure on the site.  Your arm or hand becomes pale, cool, tingly, or numb. This information is not intended to replace advice given to you by your health care provider. Make sure you discuss any questions you have with your health care provider. Document Released: 10/08/2010 Document Revised: 02/11/2016 Document Reviewed: 03/24/2014 Elsevier Interactive Patient Education  2018 [employer].

## 2018-08-30 NOTE — H&P (Addendum)
Chief Complaint: Patient was seen in consultation today for cerebral arteriogram at the request of Dr Arrie Eastern   Supervising Physician: Luanne Bras  Patient Status: [facility] - Out-pt  History of Present Illness: Melissa Owens is a 37 y.o. female   Hx Migraines Noted headache on Tues 12/3 But additional right neck pain Was treated for PNA week or so earlier with bad cough Also stated she was having blurred vision off and on  08/26/2018:  MRA: MRA neck: 1. Eccentric T1 hyperintense signal in wall of right V3 segment just above C1 with associated moderate lumen stenosis compatible with intramural hematoma of the vessel wall. No dissection flap. 2. Diffuse venous enhancement of the post-contrast MRA of the neck, noncontributory to question arteriovenous fistula. No venous flow related signal on the noncontrast MRA of the neck to suggest high flow shunting. 3. Left vertebral and bilateral carotid systems are widely patent.  Was discharged on ASA daily after seeing Dr Aroor in ED Now scheduled for cerebral arteriogram   Past Medical History:  Diagnosis Date  . Chicken pox   . Exercise-induced asthma 11/21/2017  . High cholesterol   . Migraines   . Urinary tract infection     Past Surgical History:  Procedure Laterality Date  . CESAREAN SECTION  2009  . WISDOM TOOTH EXTRACTION      Allergies: Sulfa antibiotics  Medications: Prior to Admission medications   Medication Sig Start Date End Date Taking? Authorizing Provider  albuterol (PROVENTIL HFA;VENTOLIN HFA) 108 (90 Base) MCG/ACT inhaler Inhale 1-2 puffs into the lungs every 4 (four) hours as needed for wheezing or shortness of breath.   Yes [provider]  aspirin EC 81 MG tablet Take 81 mg by mouth daily.   Yes [provider]  buPROPion (WELLBUTRIN XL) 300 MG 24 hr tablet Take 1 tablet by mouth daily 08/20/18  Yes Copland, Gay Filler, MD  cyclobenzaprine (FLEXERIL) 10 MG tablet Take 0.5-1  tablets (5-10 mg total) by mouth 3 (three) times daily as needed for muscle spasms. 08/24/18  Yes Marrian Salvage, FNP  doxycycline (VIBRAMYCIN) 100 MG capsule Take 1 capsule (100 mg total) by mouth 2 (two) times daily. One po bid x 7 days 08/26/18  Yes Harris, Abigail, PA-C  Multiple Vitamins-Minerals (WOMENS MULTIVITAMIN PO) Take 1 tablet by mouth daily.   Yes [provider]  norethindrone-ethinyl estradiol (MICROGESTIN,JUNEL,LOESTRIN) 1-20 MG-MCG tablet Take 1 tablet by mouth See admin instructions. Take 1 tablet daily for 21 days per month 09/20/17  Yes [provider]  oxyCODONE (ROXICODONE) 5 MG immediate release tablet Take 0.5-1 tablets (2.5-5 mg total) by mouth every 6 (six) hours as needed for severe pain. 08/26/18  Yes Margarita Mail, PA-C     Family History  Problem Relation Age of Onset  . Hypertension Mother   . High Cholesterol Mother   . Cancer Father   . Heart attack Father   . Heart disease Father   . Arthritis Maternal Grandmother   . Cancer Maternal Grandmother   . High Cholesterol Maternal Grandmother   . High blood pressure Maternal Grandmother   . Stroke Maternal Grandmother   . Arthritis Maternal Grandfather   . Diabetes Maternal Grandfather   . Hearing loss Maternal Grandfather   . High Cholesterol Maternal Grandfather   . High blood pressure Maternal Grandfather   . Asthma Paternal Grandmother   . Diabetes Paternal Grandmother   . Miscarriages / Stillbirths Paternal Grandmother   . COPD Paternal Grandfather   .  Alcohol abuse Brother   . Drug abuse Brother     Social History   Socioeconomic History  . Marital status: Married    Spouse name: Not on file  . Number of children: Not on file  . Years of education: Not on file  . Highest education level: Not on file  Occupational History  . Not on file  Social Needs  . Financial resource strain: Not on file  . Food insecurity:    Worry: Not on file    Inability: Not on file  .  Transportation needs:    Medical: Not on file    Non-medical: Not on file  Tobacco Use  . Smoking status: Never Smoker  . Smokeless tobacco: Never Used  Substance and Sexual Activity  . Alcohol use: Not on file  . Drug use: Not on file  . Sexual activity: Not on file  Lifestyle  . Physical activity:    Days per week: Not on file    Minutes per session: Not on file  . Stress: Not on file  Relationships  . Social connections:    Talks on phone: Not on file    Gets together: Not on file    Attends religious service: Not on file    Active member of club or organization: Not on file    Attends meetings of clubs or organizations: Not on file    Relationship status: Not on file  Other Topics Concern  . Not on file  Social History Narrative  . Not on file     Review of Systems: A 12 point ROS discussed and pertinent positives are indicated in the HPI above.  All other systems are negative.  Review of Systems  Constitutional: Negative for activity change, fatigue and fever.  HENT: Negative for tinnitus and trouble swallowing.   Eyes: Negative for visual disturbance.  Respiratory: Negative for cough and shortness of breath.   Cardiovascular: Negative for chest pain.  Musculoskeletal: Negative for gait problem.  Neurological: Positive for headaches. Negative for dizziness, tremors, seizures, syncope, facial asymmetry, speech difficulty, weakness, light-headedness and numbness.  Psychiatric/Behavioral: Negative for behavioral problems and confusion.    Vital Signs: BP (!) 141/91   Pulse 94   Temp 99 F (37.2 C) (Oral)   Ht 5\' 2"  (1.575 m)   Wt 138 lb (62.6 kg)   LMP 08/23/2018 (Approximate)   SpO2 100%   BMI 25.24 kg/m   Physical Exam Vitals signs reviewed.  Constitutional:      Appearance: Normal appearance.  HENT:     Head: Atraumatic.  Eyes:     Extraocular Movements: Extraocular movements intact.  Neck:     Musculoskeletal: Normal range of motion.    Cardiovascular:     Rate and Rhythm: Normal rate and regular rhythm.     Pulses: Normal pulses.     Heart sounds: Normal heart sounds.  Pulmonary:     Effort: Pulmonary effort is normal.     Breath sounds: Normal breath sounds.  Abdominal:     General: Abdomen is flat. Bowel sounds are normal.     Palpations: Abdomen is soft.     Tenderness: There is no abdominal tenderness.  Musculoskeletal: Normal range of motion.  Skin:    General: Skin is warm and dry.  Neurological:     General: No focal deficit present.     Mental Status: She is alert and oriented to person, place, and time.  Psychiatric:  Mood and Affect: Mood normal.        Behavior: Behavior normal.        Thought Content: Thought content normal.        Judgment: Judgment normal.     Imaging: Ct Angio Head W Or Wo Contrast  Result Date: 08/26/2018 CLINICAL DATA:  37 y/o F; right-sided head and neck pain for 1 week. EXAM: CT ANGIOGRAPHY HEAD AND NECK TECHNIQUE: Multidetector CT imaging of the head and neck was performed using the standard protocol during bolus administration of intravenous contrast. Multiplanar CT image reconstructions and MIPs were obtained to evaluate the vascular anatomy. Carotid stenosis measurements (when applicable) are obtained utilizing NASCET criteria, using the distal internal carotid diameter as the denominator. CONTRAST:  170mL ISOVUE-370 IOPAMIDOL (ISOVUE-370) INJECTION 76% COMPARISON:  None. FINDINGS: CT HEAD FINDINGS Brain: No evidence of acute infarction, hemorrhage, hydrocephalus, extra-axial collection or mass lesion/mass effect. Vascular: As below. Skull: Normal. Negative for fracture or focal lesion. Sinuses: Left maxillary sinus mucous retention cyst. Additional visible paranasal sinuses and the mastoid air cells are normally aerated. Orbits: No acute finding. Review of the MIP images confirms the above findings CTA NECK FINDINGS Aortic arch: Standard branching. Imaged portion shows no  evidence of aneurysm or dissection. No significant stenosis of the major arch vessel origins. Right carotid system: No evidence of dissection, stenosis (50% or greater) or occlusion. Left carotid system: No evidence of dissection, stenosis (50% or greater) or occlusion. Vertebral arteries: Moderate stenosis of the right vertebral artery V3 segment just above the C1 arch with eccentric lateral wall thickening (series 12, image 134 and series 19, image 40) and mild irregularity of the vessel lumen extending towards the foramen magnum. No discrete dissection flap. There is asymmetric enhancement of the surrounding venous plexus. The left vertebral artery is widely patent without dissection or aneurysm. Skeleton: Negative Other neck: Left lower lobe superior segment pneumonia. Upper chest: Negative. Review of the MIP images confirms the above findings CTA HEAD FINDINGS Anterior circulation: No significant stenosis, proximal occlusion, aneurysm, or vascular malformation. Posterior circulation: No significant stenosis, proximal occlusion, aneurysm, or vascular malformation. Venous sinuses: As permitted by contrast timing, patent. Anatomic variants: None significant Delayed phase: No abnormal intracranial enhancement. Review of the MIP images confirms the above findings IMPRESSION: CT head: No acute intracranial abnormality identified. Unremarkable CT of the head. CTA neck: 1. 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 Findings probably represent an intramural hematoma/focal dissection. Additionally, there is asymmetric enhancement of the surrounding venous plexus centered around the vessel injury. 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 2. Normal appearance of carotid systems in left vertebral artery. 3. Left lower lobe superior segment pneumonia. CTA head: Patent anterior and posterior intracranial circulation. No large  vessel occlusion, aneurysm, or significant stenosis is identified. These results were called by telephone at the time of interpretation on 08/26/2018 at 3:39 pm to Dr. Marda Stalker , who verbally acknowledged these results. Electronically Signed   By: Kristine Garbe M.D.   On: 08/26/2018 15:56   Dg Chest 2 View  Result Date: 08/20/2018 CLINICAL DATA:  Fever and cough. EXAM: CHEST - 2 VIEW COMPARISON:  None. FINDINGS: Opacity projects over the left hilum on the frontal view and posteriorly on the lateral view. The heart, right hilum, mediastinum, and pleura are otherwise normal. No other pulmonary abnormalities. IMPRESSION: The findings are most consistent with pneumonia in the superior segment of the left lower  lobe. Recommend short-term follow-up after treatment to ensure resolution, to exclude other potential etiologies. Electronically Signed   By: Dorise Bullion III M.D   On: 08/20/2018 15:29   Ct Angio Neck W And/or Wo Contrast  Result Date: 08/26/2018 CLINICAL DATA:  37 y/o F; right-sided head and neck pain for 1 week. EXAM: CT ANGIOGRAPHY HEAD AND NECK TECHNIQUE: Multidetector CT imaging of the head and neck was performed using the standard protocol during bolus administration of intravenous contrast. Multiplanar CT image reconstructions and MIPs were obtained to evaluate the vascular anatomy. Carotid stenosis measurements (when applicable) are obtained utilizing NASCET criteria, using the distal internal carotid diameter as the denominator. CONTRAST:  145mL ISOVUE-370 IOPAMIDOL (ISOVUE-370) INJECTION 76% COMPARISON:  None. FINDINGS: CT HEAD FINDINGS Brain: No evidence of acute infarction, hemorrhage, hydrocephalus, extra-axial collection or mass lesion/mass effect. Vascular: As below. Skull: Normal. Negative for fracture or focal lesion. Sinuses: Left maxillary sinus mucous retention cyst. Additional visible paranasal sinuses and the mastoid air cells are normally aerated. Orbits: No  acute finding. Review of the MIP images confirms the above findings CTA NECK FINDINGS Aortic arch: Standard branching. Imaged portion shows no evidence of aneurysm or dissection. No significant stenosis of the major arch vessel origins. Right carotid system: No evidence of dissection, stenosis (50% or greater) or occlusion. Left carotid system: No evidence of dissection, stenosis (50% or greater) or occlusion. Vertebral arteries: Moderate stenosis of the right vertebral artery V3 segment just above the C1 arch with eccentric lateral wall thickening (series 12, image 134 and series 19, image 40) and mild irregularity of the vessel lumen extending towards the foramen magnum. No discrete dissection flap. There is asymmetric enhancement of the surrounding venous plexus. The left vertebral artery is widely patent without dissection or aneurysm. Skeleton: Negative Other neck: Left lower lobe superior segment pneumonia. Upper chest: Negative. Review of the MIP images confirms the above findings CTA HEAD FINDINGS Anterior circulation: No significant stenosis, proximal occlusion, aneurysm, or vascular malformation. Posterior circulation: No significant stenosis, proximal occlusion, aneurysm, or vascular malformation. Venous sinuses: As permitted by contrast timing, patent. Anatomic variants: None significant Delayed phase: No abnormal intracranial enhancement. Review of the MIP images confirms the above findings IMPRESSION: CT head: No acute intracranial abnormality identified. Unremarkable CT of the head. CTA neck: 1. 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 Findings probably represent an intramural hematoma/focal dissection. Additionally, there is asymmetric enhancement of the surrounding venous plexus centered around the vessel injury. 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 2. Normal appearance of carotid systems in left  vertebral artery. 3. Left lower lobe superior segment pneumonia. CTA head: Patent anterior and posterior intracranial circulation. No large vessel occlusion, aneurysm, or significant stenosis is identified. These results were called by telephone at the time of interpretation on 08/26/2018 at 3:39 pm to Dr. Marda Stalker , who verbally acknowledged these results. Electronically Signed   By: Kristine Garbe M.D.   On: 08/26/2018 15:56   Mr Jodene Nam Neck W Wo Contrast  Result Date: 08/26/2018 CLINICAL DATA:  37 y/o F; right-sided neck pain. Occasional blurred vision. History of migraine headache. EXAM: MRI HEAD WITHOUT CONTRAST MRA NECK WITHOUT AND WITH CONTRAST TECHNIQUE: Multiplanar, multiecho pulse sequences of the brain and surrounding structures were obtained without intravenous contrast. Angiographic images of the neck were obtained using MRA technique with and without intravenous contrast. Carotid stenosis measurements (when applicable) are obtained utilizing NASCET criteria, using the  distal internal carotid diameter as the denominator. CONTRAST:  6 cc Gadavist COMPARISON:  None. FINDINGS: MRI HEAD FINDINGS Brain: No acute infarction, hemorrhage, hydrocephalus, extra-axial collection or mass lesion. Few nonspecific T2 FLAIR hyperintense foci are present within the bifrontal white matter. There are 2 punctate foci of chronic microhemorrhage in the right frontal white matter. No additional structural or signal abnormality of the brain. Vascular: Below. Skull and upper cervical spine: Normal marrow signal. Sinuses/Orbits: Left maxillary sinus mucous retention cyst. No additional abnormal signal of the visible paranasal sinuses and mastoid air cells. Orbits are Other: None. MRA NECK FINDINGS Aortic arch: Patent. Right common carotid artery: Patent. Right internal carotid artery: Patent. Right vertebral artery: Increased T1 signal (series 10, image 31 and series 1802, image 70) within the wall of the  right vertebral artery V3 segment just above the level of the C1 vertebral body. The wall demonstrating decreased T1 signal is eccentrically thickened resulting in moderate stenosis of the lumen (series 1802, image 86). Left common carotid artery: Patent. Left Internal carotid artery: Patent. Left Vertebral artery: Patent. There is no evidence of hemodynamically significant stenosis by NASCET criteria, occlusion, or aneurysm unless noted above. IMPRESSION: MRI head: 1. No acute intracranial abnormality. 2. Nonspecific bifrontal white matter hyperintensities are compatible with history of migraine headache possibly minimal chronic microvascular ischemic changes. MRA neck: 1. Eccentric T1 hyperintense signal in wall of right V3 segment just above C1 with associated moderate lumen stenosis compatible with intramural hematoma of the vessel wall. No dissection flap. 2. Diffuse venous enhancement of the post-contrast MRA of the neck, noncontributory to question arteriovenous fistula. No venous flow related signal on the noncontrast MRA of the neck to suggest high flow shunting. 3. Left vertebral and bilateral carotid systems are widely patent. Electronically Signed   By: Kristine Garbe M.D.   On: 08/26/2018 20:30   Mr Brain Wo Contrast  Result Date: 08/26/2018 CLINICAL DATA:  37 y/o F; right-sided neck pain. Occasional blurred vision. History of migraine headache. EXAM: MRI HEAD WITHOUT CONTRAST MRA NECK WITHOUT AND WITH CONTRAST TECHNIQUE: Multiplanar, multiecho pulse sequences of the brain and surrounding structures were obtained without intravenous contrast. Angiographic images of the neck were obtained using MRA technique with and without intravenous contrast. Carotid stenosis measurements (when applicable) are obtained utilizing NASCET criteria, using the distal internal carotid diameter as the denominator. CONTRAST:  6 cc Gadavist COMPARISON:  None. FINDINGS: MRI HEAD FINDINGS Brain: No acute  infarction, hemorrhage, hydrocephalus, extra-axial collection or mass lesion. Few nonspecific T2 FLAIR hyperintense foci are present within the bifrontal white matter. There are 2 punctate foci of chronic microhemorrhage in the right frontal white matter. No additional structural or signal abnormality of the brain. Vascular: Below. Skull and upper cervical spine: Normal marrow signal. Sinuses/Orbits: Left maxillary sinus mucous retention cyst. No additional abnormal signal of the visible paranasal sinuses and mastoid air cells. Orbits are Other: None. MRA NECK FINDINGS Aortic arch: Patent. Right common carotid artery: Patent. Right internal carotid artery: Patent. Right vertebral artery: Increased T1 signal (series 10, image 31 and series 1802, image 70) within the wall of the right vertebral artery V3 segment just above the level of the C1 vertebral body. The wall demonstrating decreased T1 signal is eccentrically thickened resulting in moderate stenosis of the lumen (series 1802, image 86). Left common carotid artery: Patent. Left Internal carotid artery: Patent. Left Vertebral artery: Patent. There is no evidence of hemodynamically significant stenosis by NASCET criteria, occlusion, or aneurysm unless noted above.  IMPRESSION: MRI head: 1. No acute intracranial abnormality. 2. Nonspecific bifrontal white matter hyperintensities are compatible with history of migraine headache possibly minimal chronic microvascular ischemic changes. MRA neck: 1. Eccentric T1 hyperintense signal in wall of right V3 segment just above C1 with associated moderate lumen stenosis compatible with intramural hematoma of the vessel wall. No dissection flap. 2. Diffuse venous enhancement of the post-contrast MRA of the neck, noncontributory to question arteriovenous fistula. No venous flow related signal on the noncontrast MRA of the neck to suggest high flow shunting. 3. Left vertebral and bilateral carotid systems are widely patent.  Electronically Signed   By: Kristine Garbe M.D.   On: 08/26/2018 20:30    Labs:  CBC: Recent Labs    11/30/17 0815 08/24/18 0906 08/26/18 1350  WBC 6.4 7.2 8.0  HGB 13.8 14.0 14.1  HCT 41.4 41.8 45.1  PLT 367.0 644.0* 611*    COAGS: Recent Labs    08/26/18 1350  INR 0.92    BMP: Recent Labs    11/30/17 0815 08/24/18 0906 08/26/18 1350  NA 138 137 135  K 4.2 4.0 3.7  CL 104 101 101  CO2 29 25 26   GLUCOSE 88 93 102*  BUN 11 13 12   CALCIUM 9.6 9.8 9.4  CREATININE 0.98 0.86 0.78  GFRNONAA  --   --  >60  GFRAA  --   --  >60    LIVER FUNCTION TESTS: Recent Labs    11/30/17 0815 08/24/18 0906 08/26/18 1350  BILITOT 0.4 0.3 0.4  AST 21 25 33  ALT 21 28 42  ALKPHOS 49 56 59  PROT 7.1 8.5* 8.0  ALBUMIN 4.2 4.3 3.7    TUMOR MARKERS: No results for input(s): AFPTM, CEA, CA199, CHROMGRNA in the last 8760 hours.  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  This interventional procedure involves the use of X-rays and because of the nature of the planned procedure, it is possible that we will have prolonged use of X-ray fluoroscopy.  Potential radiation risks to you include (but are not limited to) the following: - A slightly elevated risk for cancer  several years later in life. This risk is typically less than 0.5% percent. This risk is low in comparison to the normal incidence of human cancer, which is 33% for women and 50% for men according to the [place]. - Radiation induced injury can include skin redness, resembling a rash, tissue breakdown / ulcers and hair loss (which can be temporary or permanent).   The likelihood of either of these occurring depends  on the difficulty of the procedure and whether you are sensitive to radiation due to previous procedures, disease, or genetic conditions.   IF your procedure requires a prolonged use of radiation, you will be notified and given written instructions for further action.  It is your responsibility to monitor the irradiated area for the 2 weeks following the procedure and to notify your physician if you are concerned that you have suffered a radiation induced injury.    All of the patient's questions were answered, patient is agreeable to proceed.  Consent signed and in chart.  Thank you for this interesting consult.  I greatly enjoyed meeting PROMISE WELDIN and look forward to participating in  their care.  A copy of this report was sent to the requesting provider on this date.  Electronically Signed: Lavonia Drafts, PA-C 08/30/2018, 9:53 AM   I spent a total of  30 Minutes   in face to face in clinical consultation, greater than 50% of which was counseling/coordinating care for cerebral arteriogram

## 2018-08-30 NOTE — Procedures (Signed)
S/P $ vessel cerebral arteriogra. RT CFA approach. Findings. 1.smooth seg stenosis of RT VA in the occipital region,probable dissection

## 2018-08-31 ENCOUNTER — Encounter (HOSPITAL_COMMUNITY): Payer: Self-pay | Admitting: Interventional Radiology

## 2018-09-02 ENCOUNTER — Encounter: Payer: Self-pay | Admitting: Family Medicine

## 2018-09-03 ENCOUNTER — Encounter (HOSPITAL_COMMUNITY): Payer: Self-pay | Admitting: Radiology

## 2018-09-04 ENCOUNTER — Encounter: Payer: Self-pay | Admitting: Family Medicine

## 2018-09-20 ENCOUNTER — Encounter: Payer: Self-pay | Admitting: Family Medicine

## 2018-09-20 ENCOUNTER — Ambulatory Visit
Admission: RE | Admit: 2018-09-20 | Discharge: 2018-09-20 | Disposition: A | Discharge status: Home / Self Care | Payer: No Typology Code available for payment source | Source: Ambulatory Visit | Attending: Family Medicine | Admitting: Family Medicine

## 2018-09-20 DIAGNOSIS — J189 Pneumonia, unspecified organism: Secondary | ICD-10-CM

## 2018-09-20 DIAGNOSIS — J181 Lobar pneumonia, unspecified organism: Principal | ICD-10-CM

## 2018-10-01 ENCOUNTER — Encounter: Payer: Self-pay | Admitting: Family Medicine

## 2018-11-30 MED FILL — buPROPion HCL ER (XL) 300 M: 300 | 90 days supply | Qty: 90 | Fill #1

## 2018-12-04 NOTE — Progress Notes (Signed)
[place] [facility] [address], [place], [place] [address] [phone]  Date:  12/06/2018   Name:  Melissa Owens   DOB:  September 08, 1981   MRN:  [id number]  PCP:  Darreld Mclean, MD    Chief Complaint: Annual Exam (no pap, sees gyn)   History of Present Illness:  LESLEYANN Owens is a 38 y.o. 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 Pamala Hurry Last pap is UTD She and her husband, Dr. [name] (our pharmD at the [facility]) have 3 kids, 2 girls and a boy  She had a CMP and CBC in December- she is due for a lipid and A1c today She is wearing some sort of ring that tracks her sleep and has become aware that her sleep is not of good quality  She notes that she feels tired a lot of the time- she can feel sleepy as well as fatigued Wellbutrin helps with her energy  She does like to get a daily nap She is sleeping 7-8 hours at night and nap might be 30- 60 minutes She goes to sleep right away when she lays down at night.  She might wake up 1-2x to urinate  Does not snore that we know of  Very occasional apneic episodes at night noted by her husband  Patient Active Problem List   Diagnosis Date Noted  . Seasonal affective disorder ([place]) 11/21/2017  . 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 Exercise-induced asthma 11/21/2017    Past Medical History:  Diagnosis Date  . Chicken pox   . Exercise-induced asthma 11/21/2017  . High cholesterol   . Migraines   . Urinary tract infection     Past Surgical History:  Procedure Laterality  Date  . CESAREAN SECTION  2009  . IR ANGIO INTRA EXTRACRAN SEL COM CAROTID INNOMINATE BILAT MOD SED  08/30/2018  . IR ANGIO VERTEBRAL SEL VERTEBRAL BILAT MOD SED  08/30/2018  . WISDOM TOOTH EXTRACTION      Social History   Tobacco Use  . Smoking status: Never Smoker  . Smokeless tobacco: Never Used  Substance Use Topics  . Alcohol use: Not on file  . Drug use: Not on file    Family History  Problem Relation Age of Onset  . Hypertension Mother   . High Cholesterol Mother   . Cancer Father   . Heart attack Father   . Heart disease Father   . Arthritis Maternal Grandmother   . Cancer Maternal Grandmother   . High Cholesterol Maternal Grandmother   . High blood pressure Maternal Grandmother   . Stroke Maternal Grandmother   . Arthritis Maternal Grandfather   . Diabetes Maternal Grandfather   . Hearing loss Maternal Grandfather   . High Cholesterol Maternal Grandfather   . High blood pressure Maternal Grandfather   . Asthma Paternal Grandmother   . Diabetes Paternal Grandmother   . Miscarriages / Stillbirths Paternal Grandmother   . COPD Paternal Grandfather   . Alcohol abuse  Brother   . Drug abuse Brother     Allergies  Allergen Reactions  . Sulfa Antibiotics Hives    Medication list has been reviewed and updated.  Current Outpatient Medications on File Prior to Visit  Medication Sig Dispense Refill  . albuterol (PROVENTIL HFA;VENTOLIN HFA) 108 (90 Base) MCG/ACT inhaler Inhale 1-2 puffs into the lungs every 4 (four) hours as needed for wheezing or shortness of breath.    Marland Kitchen aspirin EC 81 MG tablet Take 81 mg by mouth daily.    Marland Kitchen buPROPion (WELLBUTRIN XL) 300 MG 24 hr tablet Take 1 tablet by mouth daily 90 tablet 3  . Multiple Vitamins-Minerals (WOMENS MULTIVITAMIN PO) Take 1 tablet by mouth daily.    . norethindrone-ethinyl estradiol (MICROGESTIN,JUNEL,LOESTRIN) 1-20 MG-MCG tablet Take 1 tablet by mouth See admin instructions. Take 1 tablet daily for 21 days per  month  4   Current Facility-Administered Medications on File Prior to Visit  Medication Dose Route Frequency Provider Last Rate Last Dose  . ibuprofen (ADVIL,MOTRIN) tablet 600 mg  600 mg Oral Once Aminat [place], Gay Filler, MD        Review of Systems:  As per HPI- otherwise negative. No fever or chills   Physical Examination: Vitals:   12/06/18 0920  BP: 122/82  Pulse: 81  Resp: 16  Temp: 98.2 F (36.8 C)  SpO2: 98%   Vitals:   12/06/18 0920  Weight: 137 lb (62.1 kg)  Height: 5\' 2"  (1.575 m)   Body mass index is 25.06 kg/m. Ideal Body Weight: Weight in (lb) to have BMI = 25: 136.4  GEN: WDWN, NAD, Non-toxic, A & O x 3, normal weight, looks well  HEENT: Atraumatic, Normocephalic. Neck supple. No masses, No LAD.  Bilateral TM wnl, oropharynx normal.  PEERL,EOMI.   Ears and Nose: No external deformity. CV: RRR, No M/G/R. No JVD. No thrill. No extra heart sounds. PULM: CTA B, no wheezes, crackles, rhonchi. No retractions. No resp. distress. No accessory muscle use. ABD: S, NT, ND, +BS. No rebound. No HSM. EXTR: No c/c/e NEURO Normal gait.  PSYCH: Normally interactive. Conversant. Not depressed or anxious appearing.  Calm demeanor.    Assessment and Plan: Physical exam  Screening for diabetes mellitus - Plan: Hemoglobin A1c  Screening for hyperlipidemia - Plan: Lipid panel  Chronic fatigue - Plan: TSH, T3, free, Ambulatory referral to Neurology  Daytime somnolence - Plan: TSH, T3, free, Ambulatory referral to Neurology  Seasonal affective disorder ([place])  Vertebral artery dissection ([facility])  Here today for a CPE Overall doing well except for fatigue Will obtain labs as above Sleep study to eval her sleep patterns  Will plan further follow- up pending labs.   Signed Lamar Blinks, MD  Received her labs  Results for orders placed or performed in visit on 12/06/18  Hemoglobin A1c  Result Value Ref Range   Hgb A1c MFr Bld 5.5 4.6 - 6.5 %  Lipid panel   Result Value Ref Range   Cholesterol 230 (H) 0 - 200 mg/dL   Triglycerides 109.0 0.0 - 149.0 mg/dL   HDL 55.50 >39.00 mg/dL   VLDL 21.8 0.0 - 40.0 mg/dL   LDL Cholesterol 153 (H) 0 - 99 mg/dL   Total CHOL/HDL Ratio 4    NonHDL 174.99   TSH  Result Value Ref Range   TSH 1.96 0.35 - 4.50 uIU/mL  T3, free  Result Value Ref Range   T3, Free 4.1 2.3 - 4.2 pg/mL   Message to pt

## 2018-12-06 ENCOUNTER — Encounter: Payer: Self-pay | Admitting: Family Medicine

## 2018-12-06 ENCOUNTER — Ambulatory Visit (INDEPENDENT_AMBULATORY_CARE_PROVIDER_SITE_OTHER): Payer: No Typology Code available for payment source | Admitting: Family Medicine

## 2018-12-06 ENCOUNTER — Other Ambulatory Visit: Payer: Self-pay

## 2018-12-06 VITALS — BP 122/82 | HR 81 | Temp 98.2°F | Resp 16 | Ht 62.0 in | Wt 137.0 lb

## 2018-12-06 DIAGNOSIS — Z0000 Encounter for general adult medical examination without abnormal findings: Secondary | ICD-10-CM | POA: Diagnosis not present

## 2018-12-06 DIAGNOSIS — Z131 Encounter for screening for diabetes mellitus: Secondary | ICD-10-CM

## 2018-12-06 DIAGNOSIS — I7774 Dissection of vertebral artery: Secondary | ICD-10-CM

## 2018-12-06 DIAGNOSIS — R5382 Chronic fatigue, unspecified: Secondary | ICD-10-CM | POA: Diagnosis not present

## 2018-12-06 DIAGNOSIS — R400 Somnolence: Secondary | ICD-10-CM | POA: Diagnosis not present

## 2018-12-06 DIAGNOSIS — Z13220 Encounter for screening for lipoid disorders: Secondary | ICD-10-CM | POA: Diagnosis not present

## 2018-12-06 DIAGNOSIS — F338 Other recurrent depressive disorders: Secondary | ICD-10-CM

## 2018-12-06 LAB — LIPID PANEL
Cholesterol: 230 mg/dL — ABNORMAL HIGH (ref 0–200)
HDL: 55.5 mg/dL (ref >39.00)
LDL CALC: 153 mg/dL — AB (ref 0–99)
NonHDL: 174.99
TRIGLYCERIDES: 109 mg/dL (ref 0.0–149.0)
Total CHOL/HDL Ratio: 4
VLDL: 21.8 mg/dL (ref 0.0–40.0)

## 2018-12-06 LAB — TSH: TSH: 1.96 u[IU]/mL (ref 0.35–4.50)

## 2018-12-06 LAB — HEMOGLOBIN A1C: Hgb A1c MFr Bld: 5.5 % (ref 4.6–6.5)

## 2018-12-06 LAB — T3, FREE: T3, Free: 4.1 pg/mL (ref 2.3–4.2)

## 2018-12-06 NOTE — Patient Instructions (Addendum)
Good to see you again today- stay safe!  I will be in touch with your labs asap  I am referring you to neurology for a sleep evaluation Will also check on your thyroid levels  I will contact Dr. Estanislado Pandy for you and let him know that you are ready for your repeat CT for the vertebral artery dissection   Health Maintenance, Female Adopting a healthy lifestyle and getting preventive care can go a long way to promote health and wellness. Talk with your health care provider about what schedule of regular examinations is right for you. This is a good chance for you to check in with your provider about disease prevention and staying healthy. In between checkups, there are plenty of things you can do on your own. Experts have done a lot of research about which lifestyle changes and preventive measures are most likely to keep you healthy. Ask your health care provider for more information. Weight and diet Eat a healthy diet  Be sure to include plenty of vegetables, fruits, low-fat dairy products, and lean protein.  Do not eat a lot of foods high in solid fats, added sugars, or salt.  Get regular exercise. This is one of the most important things you can do for your health. ? Most adults should exercise for at least 150 minutes each week. The exercise should increase your heart rate and make you sweat (moderate-intensity exercise). ? Most adults should also do strengthening exercises at least twice a week. This is in addition to the moderate-intensity exercise. Maintain a healthy weight  Body mass index (BMI) is a measurement that can be used to identify possible weight problems. It estimates body fat based on height and weight. Your health care provider can help determine your BMI and help you achieve or maintain a healthy weight.  For females 9 years of age and older: ? A BMI below 18.5 is considered underweight. ? A BMI of 18.5 to 24.9 is normal. ? A BMI of 25 to 29.9 is considered  overweight. ? A BMI of 30 and above is considered obese. Watch levels of cholesterol and blood lipids  You should start having your blood tested for lipids and cholesterol at 38 years of age, then have this test every 5 years.  You may need to have your cholesterol levels checked more often if: ? Your lipid or cholesterol levels are high. ? You are older than 38 years of age. ? You are at high risk for heart disease. Cancer screening Lung Cancer  Lung cancer screening is recommended for adults 62-61 years old who are at high risk for lung cancer because of a history of smoking.  A yearly low-dose CT scan of the lungs is recommended for people who: ? Currently smoke. ? Have quit within the past 15 years. ? Have at least a 30-pack-year history of smoking. A pack year is smoking an average of one pack of cigarettes a day for 1 year.  Yearly screening should continue until it has been 15 years since you quit.  Yearly screening should stop if you develop a health problem that would prevent you from having lung cancer treatment. Breast Cancer  Practice breast self-awareness. This means understanding how your breasts normally appear and feel.  It also means doing regular breast self-exams. Let your health care provider know about any changes, no matter how small.  If you are in your 20s or 30s, you should have a clinical breast exam (CBE) by a health  care provider every 1-3 years as part of a regular health exam.  If you are 63 or older, have a CBE every year. Also consider having a breast X-ray (mammogram) every year.  If you have a family history of breast cancer, talk to your health care provider about genetic screening.  If you are at high risk for breast cancer, talk to your health care provider about having an MRI and a mammogram every year.  Breast cancer gene (BRCA) assessment is recommended for women who have family members with BRCA-related cancers. BRCA-related cancers  include: ? Breast. ? Ovarian. ? Tubal. ? Peritoneal cancers.  Results of the assessment will determine the need for genetic counseling and BRCA1 and BRCA2 testing. Cervical Cancer Your health care provider may recommend that you be screened regularly for cancer of the pelvic organs (ovaries, uterus, and vagina). This screening involves a pelvic examination, including checking for microscopic changes to the surface of your cervix (Pap test). You may be encouraged to have this screening done every 3 years, beginning at age 1.  For women ages 87-65, health care providers may recommend pelvic exams and Pap testing every 3 years, or they may recommend the Pap and pelvic exam, combined with testing for human papilloma virus (HPV), every 5 years. Some types of HPV increase your risk of cervical cancer. Testing for HPV may also be done on women of any age with unclear Pap test results.  Other health care providers may not recommend any screening for nonpregnant women who are considered low risk for pelvic cancer and who do not have symptoms. Ask your health care provider if a screening pelvic exam is right for you.  If you have had past treatment for cervical cancer or a condition that could lead to cancer, you need Pap tests and screening for cancer for at least 20 years after your treatment. If Pap tests have been discontinued, your risk factors (such as having a new sexual partner) need to be reassessed to determine if screening should resume. Some women have medical problems that increase the chance of getting cervical cancer. In these cases, your health care provider may recommend more frequent screening and Pap tests. Colorectal Cancer  This type of cancer can be detected and often prevented.  Routine colorectal cancer screening usually begins at 38 years of age and continues through 38 years of age.  Your health care provider may recommend screening at an earlier age if you have risk factors for  colon cancer.  Your health care provider may also recommend using home test kits to check for hidden blood in the stool.  A small camera at the end of a tube can be used to examine your colon directly (sigmoidoscopy or colonoscopy). This is done to check for the earliest forms of colorectal cancer.  Routine screening usually begins at age 64.  Direct examination of the colon should be repeated every 5-10 years through 38 years of age. However, you may need to be screened more often if early forms of precancerous polyps or small growths are found. Skin Cancer  Check your skin from head to toe regularly.  Tell your health care provider about any new moles or changes in moles, especially if there is a change in a mole's shape or color.  Also tell your health care provider if you have a mole that is larger than the size of a pencil eraser.  Always use sunscreen. Apply sunscreen liberally and repeatedly throughout the day.  Protect  yourself by wearing long sleeves, pants, a wide-brimmed hat, and sunglasses whenever you are outside. Heart disease, diabetes, and high blood pressure  High blood pressure causes heart disease and increases the risk of stroke. High blood pressure is more likely to develop in: ? People who have blood pressure in the high end of the normal range (130-139/85-89 mm Hg). ? People who are overweight or obese. ? People who are African American.  If you are 67-22 years of age, have your blood pressure checked every 3-5 years. If you are 66 years of age or older, have your blood pressure checked every year. You should have your blood pressure measured twice-once when you are at a hospital or clinic, and once when you are not at a hospital or clinic. Record the average of the two measurements. To check your blood pressure when you are not at a hospital or clinic, you can use: ? An automated blood pressure machine at a pharmacy. ? A home blood pressure monitor.  If you are  between 37 years and 26 years old, ask your health care provider if you should take aspirin to prevent strokes.  Have regular diabetes screenings. This involves taking a blood sample to check your fasting blood sugar level. ? If you are at a normal weight and have a low risk for diabetes, have this test once every three years after 38 years of age. ? If you are overweight and have a high risk for diabetes, consider being tested at a younger age or more often. Preventing infection Hepatitis B  If you have a higher risk for hepatitis B, you should be screened for this virus. You are considered at high risk for hepatitis B if: ? You were born in a country where hepatitis B is common. Ask your health care provider which countries are considered high risk. ? Your parents were born in a high-risk country, and you have not been immunized against hepatitis B (hepatitis B vaccine). ? You have HIV or AIDS. ? You use needles to inject street drugs. ? You live with someone who has hepatitis B. ? You have had sex with someone who has hepatitis B. ? You get hemodialysis treatment. ? You take certain medicines for conditions, including cancer, organ transplantation, and autoimmune conditions. Hepatitis C  Blood testing is recommended for: ? Everyone born from [birth year] through 1965. ? Anyone with known risk factors for hepatitis C. Sexually transmitted infections (STIs)  You should be screened for sexually transmitted infections (STIs) including gonorrhea and chlamydia if: ? You are sexually active and are younger than 38 years of age. ? You are older than 38 years of age and your health care provider tells you that you are at risk for this type of infection. ? Your sexual activity has changed since you were last screened and you are at an increased risk for chlamydia or gonorrhea. Ask your health care provider if you are at risk.  If you do not have HIV, but are at risk, it may be recommended that you take  a prescription medicine daily to prevent HIV infection. This is called pre-exposure prophylaxis (PrEP). You are considered at risk if: ? You are sexually active and do not regularly use condoms or know the HIV status of your partner(s). ? You take drugs by injection. ? You are sexually active with a partner who has HIV. Talk with your health care provider about whether you are at high risk of being infected with HIV. If  you choose to begin PrEP, you should first be tested for HIV. You should then be tested every 3 months for as long as you are taking PrEP. Pregnancy  If you are premenopausal and you may become pregnant, ask your health care provider about preconception counseling.  If you may become pregnant, take 400 to 800 micrograms (mcg) of folic acid every day.  If you want to prevent pregnancy, talk to your health care provider about birth control (contraception). Osteoporosis and menopause  Osteoporosis is a disease in which the bones lose minerals and strength with aging. This can result in serious bone fractures. Your risk for osteoporosis can be identified using a bone density scan.  If you are 65 years of age or older, or if you are at risk for osteoporosis and fractures, ask your health care provider if you should be screened.  Ask your health care provider whether you should take a calcium or vitamin D supplement to lower your risk for osteoporosis.  Menopause may have certain physical symptoms and risks.  Hormone replacement therapy may reduce some of these symptoms and risks. Talk to your health care provider about whether hormone replacement therapy is right for you. Follow these instructions at home:  Schedule regular health, dental, and eye exams.  Stay current with your immunizations.  Do not use any tobacco products including cigarettes, chewing tobacco, or electronic cigarettes.  If you are pregnant, do not drink alcohol.  If you are breastfeeding, limit how  much and how often you drink alcohol.  Limit alcohol intake to no more than 1 drink per day for nonpregnant women. One drink equals 12 ounces of beer, 5 ounces of wine, or 1 ounces of hard liquor.  Do not use street drugs.  Do not share needles.  Ask your health care provider for help if you need support or information about quitting drugs.  Tell your health care provider if you often feel depressed.  Tell your health care provider if you have ever been abused or do not feel safe at home. This information is not intended to replace advice given to you by your health care provider. Make sure you discuss any questions you have with your health care provider. Document Released: 03/21/2011 Document Revised: 02/11/2016 Document Reviewed: 06/09/2015 Elsevier Interactive Patient Education  2019 [employer].

## 2018-12-23 ENCOUNTER — Encounter: Payer: Self-pay | Admitting: Family Medicine

## 2018-12-24 ENCOUNTER — Encounter: Payer: Self-pay | Admitting: Family Medicine

## 2018-12-24 ENCOUNTER — Ambulatory Visit (INDEPENDENT_AMBULATORY_CARE_PROVIDER_SITE_OTHER): Payer: No Typology Code available for payment source | Admitting: Family Medicine

## 2018-12-24 ENCOUNTER — Other Ambulatory Visit: Payer: Self-pay

## 2018-12-24 DIAGNOSIS — I7774 Dissection of vertebral artery: Secondary | ICD-10-CM | POA: Diagnosis not present

## 2018-12-24 DIAGNOSIS — H43392 Other vitreous opacities, left eye: Secondary | ICD-10-CM

## 2018-12-24 NOTE — Progress Notes (Addendum)
Melissa Owens [address] [place], [place] [address] [phone]  Date:  12/24/2018   Name:  Melissa Owens   DOB:  02-24-1981   MRN:  [id number]  PCP:  Darreld Mclean, MD    Chief Complaint: No chief complaint on file.   History of Present Illness:  Melissa Owens is a 38 y.o. very pleasant female patient who presents with the following:  Generally healthy young woman who I saw for a physical just about 2 weeks ago. WebEx virtual visit today due to COVID-19 outbreak.  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  At her last visit she was back to full activity and not having any headaches. I did set her up for a neurology consultation at her last visit due to concern about poor sleep- this has not been done yet   Most recent CT angiogram, from 08/30/2018  IMPRESSION: Focal segmental areas of mild narrowing involving the distal left vertebral artery at the level of the occipital bone associated with approximately 20-30% stenosis at these sites. No angiographic evidence of intimal flap, or of a pseudo aneurysm noted. This most likely represents a small non flow limiting dissection.  PLAN: Angiographic findings were reviewed with the patient and spouse. Patient was advised to continue with the one baby aspirin and to avoid sudden jerking or rotational movements of her neck. A follow-up CT angiogram of the head and neck will be undertaken in approximately 3-4 months.  Today is Monday Melissa Owens noted on Friday that she felt a little bit dizzy and "off."  Then later on Friday she had a slight headache like what she had back in December.  It was on her left side- the issue she had back in December was on her RIGHT side-please note that radiology impression  below mentioned the left side.  We think this was a typo. She has noted some tenderness in her posterior LEFT neck like what she had in the past   She was on crestor for years in the past, but stopped taking it last year as she was on such a small dose and her numbers were good She took this med again over the weekend in case it might help She also took excedrin over the weekend which helped just a bit  No weakness or numbness in her body No difficulty using any of her limbs, no slurred speech or difficulty swallowing She has noted floaters in her left eye when she looks at a screen.  She has noted these for 5-7 days No cough, SOB or fever She has noted some double vision only when she is running on a treadmill and looks at her phone.  Otherwise she does not have this issue She had lasik surgery years ago and does not wear corrective lenses  She had been a pt at [facility] but has not been since her eye surgery as she did not need to go in Besides the above her vision is ok No eye pain No curtain coming down over her vision No flashing lights   She never did hear back about doing the follow-up CT angio- likely due to current pandemic  The HA was not severe. No vomiting She did have diarrhea last week,  No recent travel or  known COVID contact  LMP was last week-she does not suspect pregnancy  Patient Active Problem List   Diagnosis Date Noted  . Seasonal affective disorder ([place]) 11/21/2017  . 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 Exercise-induced asthma 11/21/2017    Past Medical History:  Diagnosis Date  . Chicken pox   . Exercise-induced asthma 11/21/2017  . High cholesterol   . Migraines   . Urinary tract infection     Past Surgical History:  Procedure Laterality Date  . CESAREAN SECTION  2009  . IR ANGIO INTRA EXTRACRAN SEL COM CAROTID INNOMINATE BILAT MOD SED  08/30/2018  . IR ANGIO VERTEBRAL SEL VERTEBRAL BILAT MOD SED  08/30/2018  . WISDOM TOOTH EXTRACTION       Social History   Tobacco Use  . Smoking status: Never Smoker  . Smokeless tobacco: Never Used  Substance Use Topics  . Alcohol use: Not on file  . Drug use: Not on file    Family History  Problem Relation Age of Onset  . Hypertension Mother   . High Cholesterol Mother   . Cancer Father   . Heart attack Father   . Heart disease Father   . Arthritis Maternal Grandmother   . Cancer Maternal Grandmother   . High Cholesterol Maternal Grandmother   . High blood pressure Maternal Grandmother   . Stroke Maternal Grandmother   . Arthritis Maternal Grandfather   . Diabetes Maternal Grandfather   . Hearing loss Maternal Grandfather   . High Cholesterol Maternal Grandfather   . High blood pressure Maternal Grandfather   . Asthma Paternal Grandmother   . Diabetes Paternal Grandmother   . Miscarriages / Stillbirths Paternal Grandmother   . COPD Paternal Grandfather   . Alcohol abuse Brother   . Drug abuse Brother     Allergies  Allergen Reactions  . Sulfa Antibiotics Hives    Medication list has been reviewed and updated.  Current Outpatient Medications on File Prior to Visit  Medication Sig Dispense Refill  . albuterol (PROVENTIL HFA;VENTOLIN HFA) 108 (90 Base) MCG/ACT inhaler Inhale 1-2 puffs into the lungs every 4 (four) hours as needed for wheezing or shortness of breath.    Marland Kitchen aspirin EC 81 MG tablet Take 81 mg by mouth daily.    Marland Kitchen buPROPion (WELLBUTRIN XL) 300 MG 24 hr tablet Take 1 tablet by mouth daily 90 tablet 3  . Multiple Vitamins-Minerals (WOMENS MULTIVITAMIN PO) Take 1 tablet by mouth daily.    . norethindrone-ethinyl estradiol (MICROGESTIN,JUNEL,LOESTRIN) 1-20 MG-MCG tablet Take 1 tablet by mouth See admin instructions. Take 1 tablet daily for 21 days per month  4   Current Facility-Administered Medications on File Prior to Visit  Medication Dose Route Frequency Provider Last Rate Last Dose  . ibuprofen (ADVIL,MOTRIN) tablet 600 mg  600 mg Oral Once Melissa Owens,  Gay Filler, MD        Review of Systems:  As above- no fever noted   Physical Examination: There were no vitals filed for this visit. There were no vitals filed for this visit. There is no height or weight on file to calculate BMI. Ideal Body Weight:   Her BP was recently 129/81  Patient is observed over WebEx today.  She looks well, her normal self.  No facial drooping, no tachypnea or wheezing is noted.  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  She was supposed to have a repeat arteriogram, but this is been delayed due to COVID-19 outbreak.  She called in today due to concern of headache and neck pain similar to what she experienced in December.  I discussed her case with interventional radiology, who recommended a CT angiogram of her head and neck as a currently available test to evaluate this issue.  We have ordered these imaging studies for her stat and hope to get them done today  I also called ophthalmology and arranged a visit for her tomorrow at 9:45 at [facility] to evaluate her floaters and double vision Signed Lamar Blinks, MD  Her insurance denied her CT, requesting that we fax office notes for review.  We faxed notes but referrals  staff never head anything back.  I was able to call [facility] directly and got an authorization code for her CT studies at 4pm.  Unfortunately due to [place] pandemic my office has closed early and our staff are gone.  I was able to call [facility] and gave them authorization code of I- [id number].  They are going to call pt and get her scheduled.  I left my office to drive home and take over child care of my son.   Upon arriving home found that pt's CT are scheduled for 01/04/23.   Called her back at 5pm.  I do not want to wait  this long. Offered to have her go to the ER and have her scans done now. She declines- I will call [facility] tomorrow and request to get studies done tomorrow instead of 01-04-2023.   Remember that she does have eye doctor appt at 9:45 am also tomorrow- will need to work around this appt   addnd 4/7- received her CT scan reports;  Ct Angio Head W Or Wo Contrast  Result Date: 12/25/2018 CLINICAL DATA:  History of distal right vertebral artery dissection. Now with left neck pain and headache EXAM: CT ANGIOGRAPHY HEAD AND NECK TECHNIQUE: Multidetector CT imaging of the head and neck was performed using the standard protocol during bolus administration of intravenous contrast. Multiplanar CT image reconstructions and MIPs were obtained to evaluate the vascular anatomy. Carotid stenosis measurements (when applicable) are obtained utilizing NASCET criteria, using the distal internal carotid diameter as the denominator. CONTRAST:  17mL OMNIPAQUE IOHEXOL 350 MG/ML SOLN COMPARISON:  CTA neck 08/26/2018, MRI head and MRA neck 08/26/2018. Catheter angiogram 08/30/2018 FINDINGS: CT HEAD FINDINGS Brain: No evidence of acute infarction, hemorrhage, hydrocephalus, extra-axial collection or mass lesion/mass effect. Vascular: Negative for hyperdense vessel Skull: Negative Sinuses: Negative Orbits: Negative Review of the MIP images confirms the above findings CTA NECK FINDINGS Aortic arch: Normal aortic arch without aneurysm dissection or atherosclerotic disease. 4 vessel arch. Left vertebral artery origin from the arch. Right carotid system: Normal right carotid system. No atherosclerotic disease stenosis or dissection Left carotid system: Normal left carotid system. No atherosclerotic disease stenosis or dissection Vertebral arteries: Right vertebral artery is patent to the basilar. Previously noted irregularity and stenosis at the C1 level has resolved compatible with healed  dissection. No aneurysm or stenosis is present on the current study. Left vertebral artery is non dominant and widely patent. No stenosis or dissection in the left vertebral artery. This vessel was normal previously. Skeleton: Negative Other neck: Negative for mass or adenopathy Upper chest: Negative Review of the  MIP images confirms the above findings CTA HEAD FINDINGS Anterior circulation: Cavernous carotid widely patent without stenosis or aneurysm. Anterior and middle cerebral arteries patent. Hypoplastic right A1 segment. Both anterior cerebral arteries supplied from the left. Negative for aneurysm. Posterior circulation: Both vertebral arteries patent to the basilar. PICA patent bilaterally. Basilar widely patent. AICA, superior cerebellar, and posterior cerebral arteries patent without stenosis or aneurysm Venous sinuses: Patent Anatomic variants: None Delayed phase: Normal enhancement on delayed imaging Review of the MIP images confirms the above findings IMPRESSION: 1. Compared with the prior CTA of 08/26/2018, there has been interval healing of dissection of distal right vertebral artery. No residual stenosis or aneurysm. 2. Normal left vertebral artery without dissection. Normal carotid arteries bilaterally 3. Normal intracranial circulation. 4. Negative CT head Electronically Signed   By: Franchot Gallo M.D.   On: 12/25/2018 13:01   Ct Angio Neck W Or Wo Contrast  Result Date: 12/25/2018 CLINICAL DATA:  History of distal right vertebral artery dissection. Now with left neck pain and headache EXAM: CT ANGIOGRAPHY HEAD AND NECK TECHNIQUE: Multidetector CT imaging of the head and neck was performed using the standard protocol during bolus administration of intravenous contrast. Multiplanar CT image reconstructions and MIPs were obtained to evaluate the vascular anatomy. Carotid stenosis measurements (when applicable) are obtained utilizing NASCET criteria, using the distal internal carotid diameter as  the denominator. CONTRAST:  189mL OMNIPAQUE IOHEXOL 350 MG/ML SOLN COMPARISON:  CTA neck 08/26/2018, MRI head and MRA neck 08/26/2018. Catheter angiogram 08/30/2018 FINDINGS: CT HEAD FINDINGS Brain: No evidence of acute infarction, hemorrhage, hydrocephalus, extra-axial collection or mass lesion/mass effect. Vascular: Negative for hyperdense vessel Skull: Negative Sinuses: Negative Orbits: Negative Review of the MIP images confirms the above findings CTA NECK FINDINGS Aortic arch: Normal aortic arch without aneurysm dissection or atherosclerotic disease. 4 vessel arch. Left vertebral artery origin from the arch. Right carotid system: Normal right carotid system. No atherosclerotic disease stenosis or dissection Left carotid system: Normal left carotid system. No atherosclerotic disease stenosis or dissection Vertebral arteries: Right vertebral artery is patent to the basilar. Previously noted irregularity and stenosis at the C1 level has resolved compatible with healed dissection. No aneurysm or stenosis is present on the current study. Left vertebral artery is non dominant and widely patent. No stenosis or dissection in the left vertebral artery. This vessel was normal previously. Skeleton: Negative Other neck: Negative for mass or adenopathy Upper chest: Negative Review of the MIP images confirms the above findings CTA HEAD FINDINGS Anterior circulation: Cavernous carotid widely patent without stenosis or aneurysm. Anterior and middle cerebral arteries patent. Hypoplastic right A1 segment. Both anterior cerebral arteries supplied from the left. Negative for aneurysm. Posterior circulation: Both vertebral arteries patent to the basilar. PICA patent bilaterally. Basilar widely patent. AICA, superior cerebellar, and posterior cerebral arteries patent without stenosis or aneurysm Venous sinuses: Patent Anatomic variants: None Delayed phase: Normal enhancement on delayed imaging Review of the MIP images confirms the  above findings IMPRESSION: 1. Compared with the prior CTA of 08/26/2018, there has been interval healing of dissection of distal right vertebral artery. No residual stenosis or aneurysm. 2. Normal left vertebral artery without dissection. Normal carotid arteries bilaterally 3. Normal intracranial circulation. 4. Negative CT head Electronically Signed   By: Franchot Gallo M.D.   On: 12/25/2018 13:01   Called pt- she saw the eye doctor, nothing emergent.  They plan to recheck her in a few weeks Great news and that her CT scans are normal.  The right sided vertebral artery dissection has healed.  Nothing else abnormal.  I will forward her scan results to her interventional radiologist.  Today her headache is really gone, she is feeling fine.  She will let me know if symptoms return

## 2018-12-25 ENCOUNTER — Ambulatory Visit (HOSPITAL_BASED_OUTPATIENT_CLINIC_OR_DEPARTMENT_OTHER)
Admission: RE | Admit: 2018-12-25 | Discharge: 2018-12-25 | Disposition: A | Discharge status: Home / Self Care | Payer: No Typology Code available for payment source | Source: Ambulatory Visit | Attending: Family Medicine | Admitting: Family Medicine

## 2018-12-25 ENCOUNTER — Encounter: Payer: Self-pay | Admitting: Family Medicine

## 2018-12-25 ENCOUNTER — Encounter (HOSPITAL_BASED_OUTPATIENT_CLINIC_OR_DEPARTMENT_OTHER): Payer: Self-pay

## 2018-12-25 DIAGNOSIS — I7774 Dissection of vertebral artery: Secondary | ICD-10-CM | POA: Diagnosis not present

## 2018-12-25 MED ORDER — IOHEXOL 350 MG/ML SOLN
100.0000 mL | Freq: Once | INTRAVENOUS | Status: AC | PRN
  Administered 2018-12-25: 100 mL via INTRAVENOUS

## 2018-12-25 NOTE — Addendum Note (Signed)
Addended by: Wynonia Musty A on: 12/25/2018 09:52 AM   Modules accepted: Orders

## 2018-12-26 ENCOUNTER — Other Ambulatory Visit: Payer: No Typology Code available for payment source

## 2018-12-26 MED ORDER — ROSUVASTATIN CALCIUM 5 MG PO TABS: 2.5000 mg | ORAL_TABLET | Freq: Every day | ORAL | 3 refills | Status: DC

## 2018-12-26 MED FILL — ROSUVASTATIN CALCIUM 5 MG T: 5 | 90 days supply | Qty: 45 | Fill #0

## 2018-12-27 ENCOUNTER — Telehealth: Payer: Self-pay | Admitting: *Deleted

## 2018-12-27 NOTE — Telephone Encounter (Signed)
Received Comprehensive Eye Exam Report from [facility]; forwarded to provider/SLS 04/09

## 2019-01-19 MED FILL — NORETHIND-ETH ESTRAD 1-0.02: 1-20 | 84 days supply | Qty: 84 | Fill #1

## 2019-01-23 ENCOUNTER — Other Ambulatory Visit: Payer: Self-pay

## 2019-01-23 MED ORDER — ALBUTEROL SULFATE HFA 108 (90 BASE) MCG/ACT IN AERS: 1.0000 | INHALATION_SPRAY | RESPIRATORY_TRACT | 1 refills | Status: DC | PRN

## 2019-01-23 MED FILL — ALBUTEROL SULFATE HFA 108 (: 108 (90 BAS | 25 days supply | Qty: 18 | Fill #0

## 2019-02-18 ENCOUNTER — Encounter: Payer: Self-pay | Admitting: Neurology

## 2019-02-18 ENCOUNTER — Other Ambulatory Visit: Payer: Self-pay

## 2019-02-18 ENCOUNTER — Ambulatory Visit (INDEPENDENT_AMBULATORY_CARE_PROVIDER_SITE_OTHER): Payer: No Typology Code available for payment source | Admitting: Neurology

## 2019-02-18 VITALS — BP 127/80 | Temp 92.0°F | Ht 62.0 in | Wt 140.0 lb

## 2019-02-18 DIAGNOSIS — I7774 Dissection of vertebral artery: Secondary | ICD-10-CM

## 2019-02-18 DIAGNOSIS — Z820 Family history of epilepsy and other diseases of the nervous system: Secondary | ICD-10-CM

## 2019-02-18 DIAGNOSIS — F338 Other recurrent depressive disorders: Secondary | ICD-10-CM

## 2019-02-18 DIAGNOSIS — J45990 Exercise induced bronchospasm: Secondary | ICD-10-CM

## 2019-02-18 DIAGNOSIS — G4719 Other hypersomnia: Secondary | ICD-10-CM

## 2019-02-18 DIAGNOSIS — Z79899 Other long term (current) drug therapy: Secondary | ICD-10-CM

## 2019-02-18 NOTE — Patient Instructions (Signed)
Hypersomnia Hypersomnia is a condition in which a person feels very tired during the day even though he or she gets plenty of sleep at night. A person with this condition may take naps during the day and may find it very difficult to wake up from sleep. Hypersomnia may affect a person's ability to think, concentrate, drive, or remember things. What are the causes? The cause of this condition may not be known. Possible causes include:  Certain medicines.  Sleep disorders, such as narcolepsy and sleep apnea.  Injury to the head, brain, or spinal cord.  Drug or alcohol use.  Gastroesophageal reflux disease (GERD).  Tumors.  Certain medical conditions, such as depression, diabetes, or an underactive thyroid gland (hypothyroidism). What are the signs or symptoms? The main symptoms of hypersomnia include:  Feeling very tired throughout the day, regardless of how much sleep you got the night before.  Having trouble waking up. Others may find it difficult to wake you up when you are sleeping.  Sleeping for longer and longer periods at a time.  Taking naps throughout the day. Other symptoms may include:  Feeling restless, anxious, or annoyed.  Lacking energy.  Having trouble with: ? Remembering. ? Speaking. ? Thinking.  Loss of appetite.  Seeing, hearing, tasting, smelling, or feeling things that are not real (hallucinations). How is this diagnosed? This condition may be diagnosed based on:  Your symptoms and medical history.  Your sleeping habits. Your health care provider may ask you to write down your sleeping habits in a daily sleep log, along with any symptoms you have.  A series of tests that are done while you sleep (sleep study or polysomnogram).  A test that measures how quickly you can fall asleep during the day (daytime nap study or multiple sleep latency test). How is this treated? Treatment can help you manage your condition. Treatment may include:   Following a regular sleep routine.  Lifestyle changes, such as changing your eating habits, getting regular exercise, and avoiding alcohol or caffeinated beverages.  Taking medicines to make you more alert (stimulants) during the day.  Treating any underlying medical causes of hypersomnia. Follow these instructions at home: Sleep routine   Schedule the same bedtime and wake-up time each day.  Practice a relaxing bedtime routine. This may include reading, meditation, deep breathing, or taking a warm bath before going to sleep.  Get regular exercise each day. Avoid strenuous exercise in the evening hours.  Keep your sleep environment at a cooler temperature, darkened, and quiet.  Sleep with pillows and a mattress that are comfortable and supportive.  Schedule short 20-minute naps for when you feel sleepiest during the day.  Talk with your employer or teachers about your hypersomnia. If possible, adjust your schedule so that: ? You have a regular daytime work schedule. ? You can take a scheduled nap during the day. ? You do not have to work or be active at night.  Do not eat a heavy meal for a few hours before bedtime. Eat your meals at about the same times every day.  Avoid drinking alcohol or caffeinated beverages. Safety   Do not drive or use heavy machinery if you are sleepy. Ask your health care provider if it is safe for you to drive.  Wear a life jacket when swimming or spending time near water. General instructions  Take supplements and over-the-counter and prescription medicines only as told by your health care provider.  Keep a sleep log that will help  your doctor manage your condition. This may include information about: ? What time you go to bed each night. ? How often you wake up at night. ? How many hours you sleep at night. ? How often and for how long you nap during the day. ? Any observations from others, such as leg movements during sleep, sleep walking,  or snoring.  Keep all follow-up visits as told by your health care provider. This is important. Contact a health care provider if:  You have new symptoms.  Your symptoms get worse. Get help right away if:  You have serious thoughts about hurting yourself or someone else. If you ever feel like you may hurt yourself or others, or have thoughts about taking your own life, get help right away. You can go to your nearest emergency department or call:  Your local emergency services (911 in the [place]  A suicide crisis helpline, such as the [facility] at (986)520-8577. This is open 24 hours a day. Summary  Hypersomnia refers to a condition in which you feel very tired during the day even though you get plenty of sleep at night.  A person with this condition may take naps during the day and may find it very difficult to wake up from sleep.  Hypersomnia may affect a person's ability to think, concentrate, drive, or remember things.  Treatment, such as following a regular sleep routine and making some lifestyle changes, can help you manage your condition. This information is not intended to replace advice given to you by your health care provider. Make sure you discuss any questions you have with your health care provider. Document Released: 08/26/2002 Document Revised: 09/07/2017 Document Reviewed: 09/07/2017 Elsevier Interactive Patient Education  2019 [place] may have a condition called narcolepsy:  This means, that you have a sleep disorder that manifests with at times severe excessive sleepiness during the day and often with problems with sleep at night. We may have to try different medications that may help you stay awake during the day. Not everything works with everybody the same way. Wake promoting agents include stimulants and non-stimulant type medications. The most common side effects with stimulants are weight loss, insomnia, nervousness,  headaches, palpitations, rise in blood pressure, anxiety. Stimulants can be addictive and subject to abuse. Non-stimulant type wake promoting medications include Provigil and Nuvigil, most common side effects include headaches, nervousness, insomnia, hypertension. In addition there is a medication called Xyrem which has been proven to be very effective in patients with narcolepsy with or without cataplexy. Some patients with narcolepsy report episodes of weakness, such as jaw or facial weakness, legs giving out, feeling wobbly or like "Jell-o", etc. in situations of anxiety, stress, laughter, sudden sadness, surprise, etc., which is called cataplexy. You can also experience episodes of sleep paralysis during which you may feel unable to move upon awakening. Some people experience dreamlike sequences upon awakening or upon drifting off to sleep, called hypnopompic or hypnagogic hallucinations.

## 2019-02-18 NOTE — Progress Notes (Signed)
SEEP MEDICINE CONSULTATION    Provider:  Larey Seat, M D  Referring Provider: Darreld Mclean, MD Primary Care Physician:  Darreld Mclean, MD  Chief Complaint  Patient presents with  . New Patient (Initial Visit)    pt alone, rm 10. pt states that during the night her heart rate variability was pretty consistant during the night.,complains of daytime fatigue. her husband has witness apnea events and denies snoring. never had a sleep study before. RN BRUNO    HPI:  Melissa Owens is a 38 y.o. female  Is seen here as a referral from Dr. Lorelei Pont for sleep apnea evaluation.  Past Medical  History ; 3 healthy chr ildren, 1 biological and 2 adopted. No complications.  Married to Dr Owens Shark , Pharm D. 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 "     Family history: We always needed a lot of sleep in my family  My maternal grandmother fell asleep when laughing, cataplexy (?)> her brother shot himself after a vivid dream about intruders, injured his lag. Grandfather fell asleep within a minute when not active, not stimulated.    Social History: level of education college, no tobaco use in any form, and seldomly alcohol ,  Caffeine - one cup of coffee and one soda daily.  No shift work history.    Sleep habit: dinner time is between 6.30 and 7 PM - but bedtime is 10.30 PM, shares a cool, quiet and dark bedroom with husband, flat bed, 1 pillow and sleeps on her sides or back- prefers supine.  falls asleep quickly and goes once to bathroom break. She is known to talk in hr sleep, her husband has conversations she cannot recall. Mostly when her husband follows her to bed at 12.00 midnight. She rises at 5.45 in AM  on school days, now at 7. AM, but feels not refreshed.  She naps every day after lunch , up to one hour.  Naps feel refreshing, for a time.    Review of Systems: Out of a complete 14 system  review, the patient complains of only the following symptoms, and all other reviewed systems are negative. How likely are you to doze in the following situations: 0 = not likely, 1 = slight chance, 2 = moderate chance, 3 = high chance  Sitting and Reading? Watching Television? Sitting inactive in a public place (theater or meeting)? Lying down in the afternoon when circumstances permit? Sitting and talking to someone? Sitting quietly after lunch without alcohol? In a car, while stopped for a few minutes in traffic? As a passenger in a car for an hour without a break?  Total = 12/ 24    FSS : 35/ 63 - points.    Social History   Socioeconomic History  . Marital status: Married    Spouse name: Not on file  . Number of children: Not on file  . Years of education: Not on file  . Highest education level: Not on file  Occupational History  . Not on file  Social Needs  . Financial resource strain: Not on file  . Food insecurity:    Worry: Not on file    Inability: Not on file  . Transportation needs:    Medical: Not on file    Non-medical: Not on file  Tobacco Use  . Smoking status: Never Smoker  . Smokeless tobacco: Never Used  Substance and Sexual Activity  . Alcohol use: Yes    Alcohol/week: 1.0 - 2.0 standard drinks    Types: 1 - 2 Glasses of wine per week  . Drug use: Never  . Sexual activity: Not on file  Lifestyle  . Physical activity:    Days per week: Not on file    Minutes per session: Not on file  . Stress: Not on file  Relationships  . Social connections:    Talks on phone: Not on file    Gets together: Not on file    Attends religious service: Not on file    Active member of club or organization: Not on file    Attends meetings of clubs or organizations: Not on file    Relationship status: Not on file  . Intimate partner violence:    Fear of current or ex partner: Not on file    Emotionally abused: Not on file    Physically abused: Not on file     Forced sexual activity: Not on file  Other Topics Concern  . Not on file  Social History Narrative  . Not on file    Family History  Problem Relation Age of Onset  . Hypertension Mother   . High Cholesterol Mother   . Cancer Father   . Heart attack Father   . Heart disease Father   . Arthritis Maternal Grandmother   . Cancer Maternal Grandmother   . High Cholesterol Maternal Grandmother   . High blood pressure Maternal Grandmother   . Stroke Maternal Grandmother   . Arthritis Maternal Grandfather   . Diabetes Maternal Grandfather   . Hearing loss Maternal Grandfather   . High Cholesterol Maternal Grandfather   . High blood pressure Maternal Grandfather   . Asthma Paternal Grandmother   . Diabetes Paternal Grandmother   . Miscarriages / Stillbirths Paternal Grandmother   . COPD Paternal Grandfather   . Alcohol abuse Brother   . Drug abuse Brother     Past Medical History:  Diagnosis Date  . Chicken pox   . Exercise-induced asthma 11/21/2017  . High cholesterol   . Migraines   . Urinary tract infection   . Vertebral artery dissection ([place]) 08/2018   resolved    Past Surgical History:  Procedure Laterality Date  . CESAREAN SECTION  2009  . IR ANGIO INTRA EXTRACRAN SEL COM CAROTID INNOMINATE BILAT MOD SED  08/30/2018  . IR ANGIO VERTEBRAL SEL VERTEBRAL BILAT MOD SED  08/30/2018  . WISDOM TOOTH EXTRACTION      Current Outpatient Medications  Medication Sig Dispense Refill  . albuterol (VENTOLIN HFA) 108 (90 Base) MCG/ACT inhaler Inhale 1-2 puffs into the lungs every 4 (four) hours as needed for wheezing or shortness of breath. 18 Inhaler 1  . aspirin EC 81 MG tablet Take 81 mg by mouth daily.    Marland Kitchen buPROPion (WELLBUTRIN XL) 300 MG 24 hr tablet Take 1 tablet by mouth daily 90 tablet 3  . Multiple Vitamins-Minerals (WOMENS MULTIVITAMIN PO) Take 1 tablet by mouth daily.    . norethindrone-ethinyl estradiol (MICROGESTIN,JUNEL,LOESTRIN) 1-20 MG-MCG tablet Take 1 tablet by  mouth See admin instructions. Take 1 tablet daily for 21 days per month  4  . rosuvastatin (CRESTOR) 5 MG tablet Take 0.5 tablets (2.5 mg total) by mouth daily. 45 tablet 3   Current Facility-Administered Medications  Medication Dose Route Frequency Provider Last Rate Last Dose  . ibuprofen (ADVIL,MOTRIN) tablet 600 mg  600 mg Oral Once  Copland, Gay Filler, MD        Allergies as of 02/18/2019 - Review Complete 02/18/2019  Allergen Reaction Noted  . Sulfa antibiotics Hives 09/09/2016    Vitals: BP 127/80   Temp (!) 92 F (33.3 C)   Ht '5\' 2"'  (1.575 m)   Wt 140 lb (63.5 kg)   BMI 25.61 kg/m  Last Weight:  Wt Readings from Last 1 Encounters:  02/18/19 140 lb (63.5 kg)   Last Height:   Ht Readings from Last 1 Encounters:  02/18/19 '5\' 2"'  (1.575 m)    Physical exam:  General: The patient is awake, alert and appears not in acute distress. The patient is well groomed. Head: Normocephalic, atraumatic. Neck is supple. Mallampati 4: , neck circumference: 14" - soft palate is low and lower jaw is small  Cardiovascular:  Regular rate and rhythm , without  murmurs or carotid bruit, and without distended neck veins. Respiratory: Lungs are clear to auscultation. Skin:  Without evidence of edema, or rash Trunk: BMI is 25.5 - not  elevated and patient  has normal posture.  Neurologic exam : The patient is awake and alert, oriented to place and time.   Memory subjective described as intact. There is a normal attention span & concentration ability. Speech is fluent without  dysarthria, dysphonia or aphasia. Mood and affect are appropriate.  Cranial nerves: Pupils are equal and briskly reactive to light. Funduscopic exam deferred..  Extraocular movements  in vertical and horizontal planes intact and without nystagmus.  Visual fields by finger perimetry are intact. Hearing to finger rub intact.   Facial sensation intact to fine touch. Facial motor strength is symmetric and tongue and uvula  move midline. Tongue protrusion into either cheek is normal. Shoulder shrug is normal.   Motor exam: Normal tone ,muscle bulk and symmetric  strength in all extremities.  Sensory:  Fine touch, pinprick and vibration were tested in all extremities. Proprioception was normal.  Coordination: Rapid alternating movements in the fingers/hands were normal.  Finger-to-nose maneuver  normal without evidence of ataxia, dysmetria or tremor.  Gait and station: Patient walks without assistive device and is able unassisted to climb up to the exam table. Strength within normal limits. Stance is stable and normal. Tandem gait is unfragmented. Deep tendon reflexes: in the upper and lower extremities are symmetric and intact.    Assessment:  After physical and neurologic examination, review of laboratory studies, imaging, neurophysiology testing and pre-existing records, assessment is that of :   Husband has observed possible apnea spells , there is also a maternal family history of hypersomnia, possible cataplexy/ narcolepsy.   Her grandfather went fishing, caught a fish and fell in  response to the delight he felt about his success.   Plan:  Treatment plan and additional workup :  1) We can start with a HST to rule out apnea- husband confirmed she doesn't snore, just rare apneas were witnessed.  If negative for OSA_ STE 2 follows.   2) PSG followed by MSLT. HLA testing . She will have to be off Wellbutrin for 14 days. No muscle relaxants, no SSRIs and no stimulants in that time.   3) HLA here today.   Asencion Partridge Eulla Kochanowski MD 02/18/2019

## 2019-02-27 ENCOUNTER — Other Ambulatory Visit: Payer: Self-pay | Admitting: Neurology

## 2019-02-27 ENCOUNTER — Telehealth: Payer: Self-pay | Admitting: Neurology

## 2019-02-27 DIAGNOSIS — J45990 Exercise induced bronchospasm: Secondary | ICD-10-CM

## 2019-02-27 DIAGNOSIS — G4719 Other hypersomnia: Secondary | ICD-10-CM

## 2019-02-27 DIAGNOSIS — Z820 Family history of epilepsy and other diseases of the nervous system: Secondary | ICD-10-CM

## 2019-02-27 DIAGNOSIS — F338 Other recurrent depressive disorders: Secondary | ICD-10-CM

## 2019-02-27 LAB — NARCOLEPSY EVALUATION
DQA1*01:02: POSITIVE
DQB1*06:02: POSITIVE

## 2019-02-27 NOTE — Telephone Encounter (Signed)
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

## 2019-02-27 NOTE — Telephone Encounter (Signed)
Called the pt to review labs informed her of the findings. I informed her of waiting on insurance to approve for [place] to complete the HST rule out apnea first. Pt verbalized understanding.

## 2019-03-14 MED FILL — buPROPion HCL ER (XL) 300 M: 300 | 90 days supply | Qty: 90 | Fill #2

## 2019-03-25 ENCOUNTER — Ambulatory Visit (INDEPENDENT_AMBULATORY_CARE_PROVIDER_SITE_OTHER): Payer: No Typology Code available for payment source | Admitting: Neurology

## 2019-03-25 DIAGNOSIS — G4710 Hypersomnia, unspecified: Secondary | ICD-10-CM

## 2019-03-25 DIAGNOSIS — F338 Other recurrent depressive disorders: Secondary | ICD-10-CM

## 2019-03-25 DIAGNOSIS — G4719 Other hypersomnia: Secondary | ICD-10-CM

## 2019-03-25 DIAGNOSIS — Z820 Family history of epilepsy and other diseases of the nervous system: Secondary | ICD-10-CM

## 2019-03-25 DIAGNOSIS — J45990 Exercise induced bronchospasm: Secondary | ICD-10-CM

## 2019-03-25 DIAGNOSIS — I7774 Dissection of vertebral artery: Secondary | ICD-10-CM

## 2019-03-27 MED FILL — ROSUVASTATIN CALCIUM 5 MG T: 5 | 90 days supply | Qty: 45 | Fill #1

## 2019-04-08 ENCOUNTER — Telehealth: Payer: Self-pay | Admitting: Neurology

## 2019-04-08 NOTE — Telephone Encounter (Signed)
Pt has called asking for a call with the results of the sleep study as soon as they are available.

## 2019-04-08 NOTE — Telephone Encounter (Signed)
I will call the patient as soon as I get the results. Her sleep study was completed HST on 7/6. Will inform Dr Brett Fairy that she is calling.

## 2019-04-09 NOTE — Telephone Encounter (Signed)
I do not see her name on my HST list of patients. Some of the mail in studies failed to collect data and we are not ware unless the patient calls [place]. Let me research with [facility] tomorrow.

## 2019-04-09 NOTE — Addendum Note (Signed)
Addended by: Larey Seat on: 04/09/2019 04:52 PM   Modules accepted: Orders

## 2019-04-09 NOTE — Procedures (Signed)
  Patient Information     First Name: Melissa Last Name: Owens ID: [id number]  Birth Date: 1981/03/28 Age: 38 Gender: Female  Referring Provider: Darreld Mclean, MD BMI: 26.0 (W=141 lb, H=5' 2'')  Neck Circ.:  14 '' Epworth:  12/24   Sleep Study Information    Study Date: Mar 25, 2019 S/H/A Version: 001.001.001.001 / 4.1.1528 / 77  History:        [name] is a 38 y.o. female Is seen here as a referral from Dr. Lorelei Pont for sleep apnea evaluation. 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 " Family history of Narcolepsy.               Summary & Diagnosis:     This HST is negative for Obstructive Sleep Apnea - an AHI of less than 5 is considered physiologically normal. This test revealed an AHI of 1.5/h. There was loud snoring noted, reflected in an RDI of 17.5/h and this usually is diagnostic for upper airway resistance syndrome.   Recommendations:     CPAP is not needed and would not be covered by insurance. A dental device can be used to treat UARS.  Please note that the patient had early REM sleep onset, and should be evaluated for Hypersomnia due to other ( non-apnea related) causes.   Larey Seat, MD             Sleep Summary    Oxygen Saturation Statistics     Start Study Time: End Study Time: Total Recording Time:     10:34:24 PM 6:57:37 AM      8 h, 23 min  Total Sleep Time % REM of Sleep Time:  7 h, 28 min  29.7    Mean: 96 Minimum: 93 Maximum: 99  Mean of Desaturations Nadirs (%):   93  Oxygen Desaturation %: 4-9 10-20 >20 Total  Events Number Total  1 100.0  0 0.0  0 0.0  1 100.0  Oxygen Saturation: <90 <=88 <85 <80 <70  Duration (minutes): Sleep % 0.0 0.0 0.0 0.0 0.0 0.0 0.0 0.0 0.0 0.0     Respiratory Indices      Total Events REM NREM All Night  pRDI:  131  pAHI:  11 ODI:  1  pAHIc:  0  % CSR: 0.0 18.0 2.7 0.0 0.0 17.3 1.0 0.2  0.0 17.5 1.5 0.1 0.0       Pulse Rate Statistics during Sleep (BPM)      Mean:  73 Minimum: 55  Maximum:  108    Indices are calculated using technically valid sleep time of  7 hrs, 28 min. pRDI/pAHI are calculated using oxi desaturations ? 3%  Body Position Statistics  Position Supine Prone Right Left Non-Supine  Sleep (min) 297.7 28.0 71.0 52.0 151.0  Sleep % 66.3 6.2 15.8 11.6 33.7  pRDI 17.8 6.4 14.4 26.5 17.1  pAHI 1.6 0.0 1.7 1.2 1.2  ODI 0.2 0.0 0.0 0.0 0.0     Snoring Statistics Snoring Level (dB) >40 >50 >60 >70 >80 >Threshold (45)  Sleep (min) 41.4 6.2 2.8 0.5 0.0 9.2  Sleep % 9.2 1.4 0.6 0.1 0.0 2.0    Mean: 40 dB Sleep Stages Chart

## 2019-04-10 ENCOUNTER — Encounter: Payer: Self-pay | Admitting: Neurology

## 2019-04-10 ENCOUNTER — Encounter: Payer: Self-pay | Admitting: Family Medicine

## 2019-04-10 ENCOUNTER — Telehealth: Payer: Self-pay | Admitting: Neurology

## 2019-04-10 NOTE — Telephone Encounter (Signed)
-----  Message from Larey Seat, MD sent at 04/09/2019  4:52 PM EDT ----- Summary & Diagnosis:    This HST is negative for Obstructive Sleep Apnea - an AHI of less  than 5 is considered physiologically normal. This test revealed  an AHI of 1.5/h. There was loud snoring noted, reflected in an  RDI of 17.5/h and this usually is diagnostic for upper airway  resistance syndrome.   Recommendations:    CPAP is not needed and would not be covered by insurance. A  dental device can be used to treat UARS.  Please note that the patient had early REM sleep onset, and  should be evaluated for Hypersomnia due to other ( non-apnea  related) causes.   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   Larey Seat, MD

## 2019-04-10 NOTE — Telephone Encounter (Signed)
Called patient to discuss sleep study results. No answer at this time. LVM for the patient to call back.  Left mychart message as well.

## 2019-04-10 NOTE — Telephone Encounter (Signed)
patients sleep study has been read. Its under procedure tab

## 2019-04-11 MED ORDER — BUPROPION HCL ER (SR) 100 MG PO TB12: 100.0000 mg | ORAL_TABLET | Freq: Two times a day (BID) | ORAL | 0 refills | Status: DC

## 2019-04-11 MED FILL — buPROPion HCL ER (SR) 100 M: 100 | 15 days supply | Qty: 30 | Fill #0

## 2019-04-11 NOTE — Addendum Note (Signed)
Addended by: Lamar Blinks C on: 04/11/2019 11:36 AM   Modules accepted: Orders

## 2019-05-06 ENCOUNTER — Ambulatory Visit (INDEPENDENT_AMBULATORY_CARE_PROVIDER_SITE_OTHER): Payer: No Typology Code available for payment source | Admitting: Neurology

## 2019-05-06 DIAGNOSIS — G4719 Other hypersomnia: Secondary | ICD-10-CM

## 2019-05-06 DIAGNOSIS — J45990 Exercise induced bronchospasm: Secondary | ICD-10-CM

## 2019-05-06 DIAGNOSIS — Z820 Family history of epilepsy and other diseases of the nervous system: Secondary | ICD-10-CM

## 2019-05-06 DIAGNOSIS — I7774 Dissection of vertebral artery: Secondary | ICD-10-CM

## 2019-05-06 DIAGNOSIS — G4710 Hypersomnia, unspecified: Secondary | ICD-10-CM | POA: Diagnosis not present

## 2019-05-06 DIAGNOSIS — F338 Other recurrent depressive disorders: Secondary | ICD-10-CM

## 2019-05-07 ENCOUNTER — Ambulatory Visit (INDEPENDENT_AMBULATORY_CARE_PROVIDER_SITE_OTHER): Payer: No Typology Code available for payment source | Admitting: Neurology

## 2019-05-07 ENCOUNTER — Other Ambulatory Visit: Payer: Self-pay | Admitting: Neurology

## 2019-05-07 ENCOUNTER — Other Ambulatory Visit: Payer: Self-pay

## 2019-05-07 DIAGNOSIS — J45990 Exercise induced bronchospasm: Secondary | ICD-10-CM

## 2019-05-07 DIAGNOSIS — F338 Other recurrent depressive disorders: Secondary | ICD-10-CM

## 2019-05-07 DIAGNOSIS — G4752 REM sleep behavior disorder: Secondary | ICD-10-CM | POA: Diagnosis not present

## 2019-05-07 DIAGNOSIS — I7774 Dissection of vertebral artery: Secondary | ICD-10-CM

## 2019-05-07 DIAGNOSIS — Z820 Family history of epilepsy and other diseases of the nervous system: Secondary | ICD-10-CM

## 2019-05-07 DIAGNOSIS — G4719 Other hypersomnia: Secondary | ICD-10-CM

## 2019-05-07 DIAGNOSIS — Z79899 Other long term (current) drug therapy: Secondary | ICD-10-CM

## 2019-05-07 NOTE — Addendum Note (Signed)
Addended by: Inis Sizer D on: 05/07/2019 04:27 PM   Modules accepted: Orders

## 2019-05-10 LAB — COMPREHENSIVE DRUG ANALYSIS,UR

## 2019-05-12 NOTE — Procedures (Signed)
PATIENT'S NAME:  Melissa Owens, Melissa Owens DOB:      [birth year]      MR#:    [id number]     DATE OF RECORDING: 05/06/2019 REFERRING M.D.:  Lamar Blinks, MD Study Performed:   expanded EEG Polysomnogram HISTORY:  Mrs. Melissa Owens is a 38 y.o. Caucasian female seen on 02-18-2019 upon referral from Dr. Lorelei Pont for sleep apnea evaluation. She is married to Dr. Owens Shark, Pharm D, and mother of 3 healthy children, 1 biological and 2 adopted. No health complications until [id number] when she suffered a vertebral artery dissection" out of the blue". A headache resulted, no vertigo, nausea or balance problem.   "We always needed a lot of sleep in my family"- My maternal grandmother fell asleep when laughing, cataplexy (?) My brother shot himself after a vivid dream about intruders, he injured his leg. Grandfather fell asleep within a minute when not active, not stimulated".     She is known to talk in her sleep, her husband has conversations with her she cannot recall. She rises at 5.45 in AM on school days, now at 7 AM, but feels not refreshed.  She naps every day after lunch, up to one hour. Naps feel refreshing, for a time.  The patient endorsed the Epworth Sleepiness Scale at 12 points.   The patient's weight 140 pounds with a height of 62 (inches), resulting in a BMI of 26.1 kg/m2. The patient's neck circumference measured 14 inches.  CURRENT MEDICATIONS: Ventolin, Aspirin, Wellbutrin, Crestor, Advil   PROCEDURE:  This is a multichannel digital polysomnogram utilizing the Somnostar 11.2 system.  Electrodes and sensors were applied and monitored per AASM Specifications.   EEG, EOG, Chin and Limb EMG, were sampled at 200 Hz.  ECG, Snore and Nasal Pressure, Thermal Airflow, Respiratory Effort, CPAP Flow and Pressure, Oximetry was sampled at 50 Hz. Digital video and audio were recorded.      BASELINE STUDY: Lights Out was at 22:43 and Lights On at 06:00.  Total recording time (TRT) was 437 minutes, with a total sleep time (TST)  of 380 minutes.  The patient's sleep latency was 22.5 minutes.  REM latency was 72.5 minutes.  The sleep efficiency was 87.1 %.     SLEEP ARCHITECTURE: WASO (Wake after sleep onset) was 46 minutes.  There were 39.5 minutes in Stage N1, 70.5 minutes Stage N2, 178.5 minutes Stage N3 and 91.5 minutes in Stage REM.  The percentage of Stage N1 was 10.4%, Stage N2 was 18.6%, Stage N3 was 47 % and Stage R (REM sleep) was 24.1%.   RESPIRATORY ANALYSIS:  There was 1 respiratory event:  1 hypopneas. The patient also had 0 respiratory event related arousals (RERAs).   The total APNEA/HYPOPNEA INDEX (AHI) was 0.2 /hour.  1 event occurred in REM sleep and 0 events in NREM. The REM AHI was 0.7 /hour, versus a non-REM AHI of 0. The patient spent 191 minutes of total sleep time in the supine position and 189 minutes in non-supine. The supine AHI was 0.0 versus a non-supine AHI of 0.3.  OXYGEN SATURATION & C02:  The Wake baseline 02 saturation was 96%, with the lowest being 92%. Time spent below 89% saturation equaled 0 minutes.  The arousals were noted as: 92 were spontaneous, 0 were associated with PLMs, and none were associated with respiratory events. The patient had a total of 0 Periodic Limb Movements.    Audio and video analysis did not show any abnormal or unusual movements, behaviors, phonations or vocalizations.  EKG was in keeping with normal sinus rhythm (NSR). Post-study, the patient indicated that sleep was the same as usual. Described as restful and uninterrupted.    IMPRESSION:  1. Valid test for MSLT to follow.   I certify that I have reviewed the entire raw data recording prior to the issuance of this report in accordance with the Standards of Accreditation of the American Academy of Sleep Medicine (AASM)   Larey Seat, MD   05-12-2019 Diplomat, American Board of Psychiatry and Neurology  Diplomat, American Board of [place] Director, [place] [facility] at [employer]

## 2019-05-12 NOTE — Procedures (Signed)
  Name:  Melissa Owens, Prime Reference [id number]  Study Date: 05/07/2019 Procedure #: [id number]  DOB: March 19, 1981    Protocol  This is a 13 channel Multiple Sleep Latency Test comprised of 5 channels of EEG (T3-Cz, Cz-T4, F4-M1, C4-M1, O2-M1), 3 channels of Chin EMG, 4 channels of EOG and 1 channel for ECG.   All channels were sampled at 256 hz.    This polysomnographic procedure is designed to evaluate (1) the complaint of excessive daytime sleepiness by quantifying the time required to fall asleep and (2) the possibility of narcolepsy by checking for abnormally short latencies to REM sleep.  Electrographic variables include EEG, EMG, EOG and ECG.  Patients are monitored throughout four or five 20-minute opportunities to sleep (naps) at two-hour intervals.  For each nap, the patient is allowed 20 minutes to fall asleep.  Once asleep, the patient is awakened after 15 minutes.  Between naps, the patient is kept as alert as possible.  A sleep latency of 20 minutes indicates that no sleep occurred.  Parametric Analysis  Total Number of Naps 5     NAP # Time of Nap  Sleep Latency (mins) REM Latency (mins) Sleep Time Percent Awake Time Percent  1 07:29 20 0 0 100   2 09:29 13.5 0 14  86   3 11:28 20 0 0  100   4 13:32 5.5 0 66  34   5 15:19 20 0 0  100    MSLT Summary of Naps  Sleepiness Index: 21  Mean Sleep Latency to all Five Naps: 15.8  Mean Sleep Latency to First Four Naps: 14.8  Mean Sleep Latency to First Three Naps: 17.8  Mean Sleep Latency to First Two Naps: 16.8  Number of Naps with REM Sleep: 0    Results from Preceding PSG Study  Sleep Onset Time 22:52 Sleep Efficiency (%) 87%  Rise Time 06:00 Sleep Latency (min) 9 min  Total Sleep Time  6.3 h REM Latency (min) 72.5       Name:  Melissa Owens, Melissa Owens Reference #:  [id number]  Study Date: 05/07/2019 DOB: May 02, 1981    IMPRESSION:  1. This multiple sleep latency test reveals a mean sleep latency of 15.8 minutes with 3 out of 5 nap  opportunities during which no sleep was recorded.  2. There were no sleep periods during which REM sleep was recorded.   3. A total of 2 out of 5 sleep periods were recorded.   4. This study was preceded by an overnight polysomnogram with a total sleep time (TST) of 380 minutes.         RECOMMENDATIONS: This is a normal MSLT study. 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 I attest to having reviewed every epoch of the entire raw data recording prior to the issuance of this report in accordance with the Standards of the [place] of Sleep Medicine.     Larey Seat, M.D.   05-12-2019 Diplomat, American Board of Psychiatry and Neurology  Diplomat, [place] of Sleep Medicine Medical Director, [place] [facility] at [facility]

## 2019-05-13 MED FILL — NORETHIND-ETH ESTRAD 1-0.02: 1-20 | 84 days supply | Qty: 84 | Fill #2

## 2019-05-14 ENCOUNTER — Telehealth: Payer: Self-pay | Admitting: Neurology

## 2019-05-14 NOTE — Telephone Encounter (Signed)
-----  Message from Larey Seat, MD sent at 05/12/2019  2:42 PM EDT ----- IMPRESSION:   1. This multiple sleep latency test reveals a mean sleep latency  of 15.8 minutes with 3 out of 5 nap opportunities during which no  sleep was recorded.  2. There were no sleep periods during which REM sleep was  recorded.   3. A total of 2 out of 5 sleep periods were recorded.   4. This study was preceded by an overnight polysomnogram with a  total sleep time (TST) of 380 minutes.       RECOMMENDATIONS:  This is a normal MSLT study.  This study is inconsistent with narcolepsy, and may reflect  idiopathic hypersomnolence.    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  I suggest to treat with stimulants or modafinil if insurance allows medication based on HLA test result.

## 2019-05-14 NOTE — Telephone Encounter (Signed)
Called the patient and reviewed with her the sleeps study results. 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 Advised the patient based off the recommendation we could certainly try the modafinil as mentioned by Dr Brett Fairy and I will attempt first to see if we can get it approved before we send out to a pharmacy. Pt verbalized understanding. Patient made aware about [facility] as back up option with good rx coupon for discounted rate and was agreeable to this.

## 2019-05-14 NOTE — Addendum Note (Signed)
Addended by: Darleen Crocker on: 05/14/2019 11:59 AM   Modules accepted: Orders

## 2019-05-15 MED ORDER — MODAFINIL 200 MG PO TABS: 200.0000 mg | ORAL_TABLET | Freq: Every day | ORAL | 5 refills | Status: DC

## 2019-05-15 MED FILL — MODAFINIL 200 MG TABLET: 200 | 30 days supply | Qty: 30 | Fill #0

## 2019-05-15 NOTE — Addendum Note (Signed)
Addended by: Larey Seat on: 05/15/2019 01:08 PM   Modules accepted: Orders

## 2019-05-16 ENCOUNTER — Encounter: Payer: Self-pay | Admitting: Family Medicine

## 2019-05-17 ENCOUNTER — Other Ambulatory Visit: Payer: Self-pay | Admitting: Family Medicine

## 2019-05-17 NOTE — Progress Notes (Signed)
Hi, For the past 9 months now, I've been trying to get my ED visit from 08/26/18 covered by our insurance, [employer]. We paid the ER visit copay, but there was a contract issue. The doctor I saw, Dr. Sherry Ruffing, is with [facility], and they are refusing to sign a contract with [employer]. So [employer] is only paying what is usual/customary for the ER doctor's bill, which is $284.16 of the billed $877.00. The remaining $592.84 is supposedly my responsibility now. However, because I went to [place] ED and had no idea Dr. Sherry Ruffing was with [facility], I am appealing the claim. [employer]'s representative told me to get some kind of documentation from you that I needed to go to the ED. Can you please provide that? I'm not sure if it's in a record that already exists or if you would need to write a new note explaining that I first went to [facility] and was referred to the ER, at which point I called you and you agreed I should go. Any help you can provide is appreciated. Thanks, Melissa Owens   Note   Pt called me due to worsening HA She was seen by UC today, and they did recommend a CT scan Will order for her stat at [facility] imaging - however it turns out they don't do outpt CT on the weekends any longer.  Also [facility] won't do without a pre-cert.    Called pt and spoke with her and her husband.  They are going to the ER at the [facility] [facility] and alerted them        CTA neck:  1. 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 Findings probably represent an intramural hematoma/focal dissection. Additionally, there is asymmetric enhancement of the surrounding venous plexus centered around the vessel injury. 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 2. Normal appearance of carotid systems in left vertebral artery. 3. Left lower lobe superior segment pneumonia.  CTA head:  Patent  anterior and posterior intracranial circulation. No large vessel occlusion, aneurysm, or significant stenosis is identified.  These results were called by telephone at the time of interpretation on 08/26/2018 at 3:39 pm to Dr. Marda Stalker , who verbally acknowledged these results.

## 2019-06-19 MED FILL — MODAFINIL 200 MG TABLET: 200 | 30 days supply | Qty: 30 | Fill #1

## 2019-06-26 MED FILL — ROSUVASTATIN CALCIUM 5 MG T: 5 | 90 days supply | Qty: 45 | Fill #2

## 2019-07-22 MED FILL — MODAFINIL 200 MG TABLET: 200 | 30 days supply | Qty: 30 | Fill #2

## 2019-07-29 ENCOUNTER — Encounter: Payer: Self-pay | Admitting: Family Medicine

## 2019-07-29 ENCOUNTER — Other Ambulatory Visit: Payer: Self-pay

## 2019-07-29 ENCOUNTER — Ambulatory Visit
Admission: RE | Admit: 2019-07-29 | Discharge: 2019-07-29 | Disposition: A | Discharge status: Home / Self Care | Payer: No Typology Code available for payment source | Source: Ambulatory Visit | Attending: Family Medicine | Admitting: Family Medicine

## 2019-07-29 DIAGNOSIS — M25472 Effusion, left ankle: Secondary | ICD-10-CM

## 2019-08-01 MED FILL — FLUARIX QUADRIVALENT 0.5 ML: 0.5 | 1 days supply | Qty: 1 | Fill #0

## 2019-08-14 ENCOUNTER — Encounter: Payer: Self-pay | Admitting: Neurology

## 2019-08-19 ENCOUNTER — Encounter: Payer: Self-pay | Admitting: Neurology

## 2019-08-23 MED FILL — MODAFINIL 200 MG TABS: 200 | 30 days supply | Qty: 30 | Fill #3

## 2019-09-19 MED FILL — MODAFINIL 200 MG TABS: 200 | 30 days supply | Qty: 30 | Fill #4

## 2019-10-09 MED FILL — ROSUVASTATIN CALCIUM 5 MG T: 5 | 90 days supply | Qty: 45 | Fill #3

## 2019-10-16 MED FILL — ALBUTEROL SULFATE HFA 108 (: 108 (90 BAS | 25 days supply | Qty: 18 | Fill #1

## 2019-10-23 MED FILL — MODAFINIL 200 MG TABS: 200 | 30 days supply | Qty: 30 | Fill #5

## 2019-11-20 ENCOUNTER — Other Ambulatory Visit: Payer: Self-pay | Admitting: Neurology

## 2019-11-21 MED FILL — MODAFINIL 200 MG TABS: 200 | 30 days supply | Qty: 30 | Fill #0

## 2019-12-09 NOTE — Progress Notes (Addendum)
[place] at [facility] [address] [place], [place] [address] [id number] [phone]  Date:  12/11/2019   Name:  Melissa Owens   DOB:  [birth year]   MRN:  [id number]  PCP:  Darreld Mclean, MD    Chief Complaint: Annual Exam (no pap)   History of Present Illness:  Melissa Owens is a 39 y.o. very pleasant female patient who presents with the following:  Generally healthy young woman with history of exercise-induced asthma, seasonal affective disorder-here today for routine physical In 2019 she had a very unusual headache, was diagnosed with dissection of a vertebral artery No unusal headaches  Her last scan was in April of last year- showed healing of her dissection.  We are not sure if she needs to stay on asa long term  Last seen by myself in April 2020 She is married to our med center pharmacist, Dr. [name] They have 3 school age children  She has been seeing neurology-Dr. Rosanne Ashing excessive sleepiness She underwent a sleep study last year which did not show narcolepsy, but was positive for idiopathic hypersomnolence She was prescribed Provigil last September; she feels like this is helping her  She is no longer taking wellbutrin  She is taking crestor and her baby aspirin only  Pap smear; per her GYN Fogleman.  Done in 2018 Flu vaccine up-to-date Tetanus up-to-date Covid series; not done yet  Labs-offer routine labs today, she is fasting today  Mammogram not started yet  She has history of exercise-induced asthma.  However, she has noticed some increase in her symptoms recently.  She is using her rescue inhaler most days for the last couple of months not related to exercise- previously she used it just prior to exercise.  She developed asthma in college  She is allergic to cats- they do have a hypoallergenic cat, she is generally not allergic to their pet No other allergy symptoms noted such as sneezing or runny eyes  Wt  Readings from Last 3 Encounters:  12/11/19 134 lb (60.8 kg)  02/18/19 140 lb (63.5 kg)  12/06/18 137 lb (62.1 kg)     Patient Active Problem List   Diagnosis Date Noted  . Family history of narcolepsy 02/18/2019  . Excessive daytime sleepiness 02/18/2019  . 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 Seasonal affective disorder ([place]) 11/21/2017  . 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 Exercise-induced asthma 11/21/2017    Past Medical History:  Diagnosis Date  . Chicken pox   . Exercise-induced asthma 11/21/2017  . High cholesterol   . Migraines   . Urinary tract infection   . Vertebral artery dissection ([place]) 08/2018   resolved    Past Surgical History:  Procedure Laterality Date  . CESAREAN SECTION  2009  . IR ANGIO INTRA EXTRACRAN SEL COM CAROTID INNOMINATE BILAT MOD SED  08/30/2018  . IR ANGIO VERTEBRAL SEL VERTEBRAL BILAT MOD SED  08/30/2018  . WISDOM TOOTH EXTRACTION      Social History   Tobacco Use  . Smoking status: Never Smoker  . Smokeless tobacco: Never Used  Substance Use Topics  . Alcohol use: Yes    Alcohol/week: 1.0 - 2.0 standard drinks    Types: 1 - 2 Glasses of wine per week  . Drug use: Never    Family History  Problem Relation Age of Onset  . Hypertension Mother   . High Cholesterol Mother   . Cancer Father   .  Heart attack Father   . Heart disease Father   . Arthritis Maternal Grandmother   . Cancer Maternal Grandmother   . High Cholesterol Maternal Grandmother   . High blood pressure Maternal Grandmother   . Stroke Maternal Grandmother   . Arthritis Maternal Grandfather   . Diabetes Maternal Grandfather   . Hearing loss Maternal Grandfather   . High Cholesterol Maternal Grandfather   . High blood pressure Maternal Grandfather   . Asthma Paternal Grandmother   . Diabetes Paternal Grandmother   . Miscarriages / Stillbirths Paternal Grandmother   . COPD Paternal Grandfather   . Alcohol abuse Brother   . Drug abuse Brother      Allergies  Allergen Reactions  . Sulfa Antibiotics Hives    Medication list has been reviewed and updated.  Current Outpatient Medications on File Prior to Visit  Medication Sig Dispense Refill  . albuterol (VENTOLIN HFA) 108 (90 Base) MCG/ACT inhaler Inhale 1-2 puffs into the lungs every 4 (four) hours as needed for wheezing or shortness of breath. 18 Inhaler 1  . aspirin EC 81 MG tablet Take 81 mg by mouth daily.    . modafinil (PROVIGIL) 200 MG tablet TAKE 1 TABLET (200 MG TOTAL) BY MOUTH DAILY. 30 tablet 5  . Multiple Vitamins-Minerals (WOMENS MULTIVITAMIN PO) Take 1 tablet by mouth daily.    . rosuvastatin (CRESTOR) 5 MG tablet Take 0.5 tablets (2.5 mg total) by mouth daily. 45 tablet 3   Current Facility-Administered Medications on File Prior to Visit  Medication Dose Route Frequency Provider Last Rate Last Admin  . ibuprofen (ADVIL,MOTRIN) tablet 600 mg  600 mg Oral Once , Gay Filler, MD        Review of Systems:  As per HPI- otherwise negative. She and her husband enjoy exercising at home.  No chest pain or shortness of breath  Physical Examination: Vitals:   12/11/19 0938  BP: 125/79  Pulse: 76  Resp: 16  Temp: (!) 97.1 F (36.2 C)  SpO2: 100%   Vitals:   12/11/19 0938  Weight: 134 lb (60.8 kg)  Height: 5\' 2"  (1.575 m)   Body mass index is 24.51 kg/m. Ideal Body Weight: Weight in (lb) to have BMI = 25: 136.4  GEN: no acute distress.  Normal weight, looks well HEENT: Atraumatic, Normocephalic.   Bilateral TM wnl, oropharynx normal.  PEERL,EOMI.   Ears and Nose: No external deformity. CV: RRR, No M/G/R. No JVD. No thrill. No extra heart sounds. PULM: CTA B, no wheezes, crackles, rhonchi. No retractions. No resp. distress. No accessory muscle use. ABD: S, NT, ND. No rebound. No HSM. EXTR: No c/c/e PSYCH: Normally interactive. Conversant.    Assessment and Plan: Physical exam  Vertebral artery dissection (HCC)  Screening for diabetes  mellitus - Plan: Comprehensive metabolic panel, Hemoglobin A1c  Screening for hyperlipidemia - Plan: Lipid panel  Chronic fatigue  Screening for thyroid disorder - Plan: TSH  Screening for deficiency anemia - Plan: CBC  Moderate persistent asthma without complication - Plan: beclomethasone (QVAR REDIHALER) 40 MCG/ACT inhaler  Here today for physical exam Labs pending as above Her exercise-induced asthma is taking on characteristics of moderate persistent asthma.  We will have her add an inhaled steroid, I have asked her let me know how this works for her.  Can adjust dosage as needed I will touch base with her interventional radiologist about any further follow-up for her vertebral artery dissection This visit occurred during the SARS-CoV-2 public health emergency.  Safety protocols were in place, including screening questions prior to the visit, additional usage of staff PPE, and extensive cleaning of exam room while observing appropriate contact time as indicated for disinfecting solutions.   Encouraged healthy habits including exercise, clean diet, stress reduction, tobacco and alcohol avoidance Signed Lamar Blinks, MD   Received the following message from IR- will share with pt    I am one of the PAs working with Dr. Estanislado Pandy. I spoke with him regarding your questions.   He took a look at her most recent CTA- he states that all of the images were not sent through [place] (as in he cannot fully visualize the vertebral artery), however the report states that the dissection has healed (therefore the images were reviewed by radiology, just not able to be viewed today). He states that since her dissection has healed (based on report), there is no further follow-up needed. He states that if patient develops neck pain, headache, or tinnitus, to call us for further evaluation.   Regarding Aspirin use, he states that this is controversial. States that since patient has had a  dissection, than statistically speaking she is at increased risk of another dissection (even a spontaneous dissection)- because of this, he is recommending that patient continue to take Aspirin 81 mg once daily throughout her whole life.   Received her labs, message to pt  Results for orders placed or performed in visit on 12/11/19  CBC  Result Value Ref Range   WBC 6.9 4.0 - 10.5 K/uL   RBC 4.21 3.87 - 5.11 Mil/uL   Platelets 396.0 150.0 - 400.0 K/uL   Hemoglobin 12.7 12.0 - 15.0 g/dL   HCT 38.1 36.0 - 46.0 %   MCV 90.5 78.0 - 100.0 fl   MCHC 33.4 30.0 - 36.0 g/dL   RDW 14.9 11.5 - 15.5 %  Comprehensive metabolic panel  Result Value Ref Range   Sodium 134 (L) 135 - 145 mEq/L   Potassium 4.8 3.5 - 5.1 mEq/L   Chloride 101 96 - 112 mEq/L   CO2 27 19 - 32 mEq/L   Glucose, Bld 91 70 - 99 mg/dL   BUN 11 6 - 23 mg/dL   Creatinine, Ser 0.86 0.40 - 1.20 mg/dL   Total Bilirubin 0.7 0.2 - 1.2 mg/dL   Alkaline Phosphatase 76 39 - 117 U/L   AST 62 (H) 0 - 37 U/L   ALT 123 (H) 0 - 35 U/L   Total Protein 7.1 6.0 - 8.3 g/dL   Albumin 4.4 3.5 - 5.2 g/dL   GFR 73.43 >60.00 mL/min   Calcium 10.1 8.4 - 10.5 mg/dL  Hemoglobin A1c  Result Value Ref Range   Hgb A1c MFr Bld 5.1 4.6 - 6.5 %  Lipid panel  Result Value Ref Range   Cholesterol 192 0 - 200 mg/dL   Triglycerides 88.0 0.0 - 149.0 mg/dL   HDL 67.10 >39.00 mg/dL   VLDL 17.6 0.0 - 40.0 mg/dL   LDL Cholesterol 107 (H) 0 - 99 mg/dL   Total CHOL/HDL Ratio 3    NonHDL 124.48   TSH  Result Value Ref Range   TSH 1.60 0.35 - 4.50 uIU/mL

## 2019-12-10 ENCOUNTER — Other Ambulatory Visit: Payer: Self-pay

## 2019-12-11 ENCOUNTER — Encounter: Payer: Self-pay | Admitting: Family Medicine

## 2019-12-11 ENCOUNTER — Ambulatory Visit (INDEPENDENT_AMBULATORY_CARE_PROVIDER_SITE_OTHER): Payer: No Typology Code available for payment source | Admitting: Family Medicine

## 2019-12-11 VITALS — BP 125/79 | HR 76 | Temp 97.1°F | Resp 16 | Ht 62.0 in | Wt 134.0 lb

## 2019-12-11 DIAGNOSIS — Z0000 Encounter for general adult medical examination without abnormal findings: Secondary | ICD-10-CM | POA: Diagnosis not present

## 2019-12-11 DIAGNOSIS — R5382 Chronic fatigue, unspecified: Secondary | ICD-10-CM

## 2019-12-11 DIAGNOSIS — Z130 Encounter for screening for diseases of the blood and blood-forming organs and certain disorders involving the immune mechanism: Secondary | ICD-10-CM

## 2019-12-11 DIAGNOSIS — Z131 Encounter for screening for diabetes mellitus: Secondary | ICD-10-CM

## 2019-12-11 DIAGNOSIS — J4540 Moderate persistent asthma, uncomplicated: Secondary | ICD-10-CM

## 2019-12-11 DIAGNOSIS — Z1329 Encounter for screening for other suspected endocrine disorder: Secondary | ICD-10-CM | POA: Diagnosis not present

## 2019-12-11 DIAGNOSIS — I7774 Dissection of vertebral artery: Secondary | ICD-10-CM

## 2019-12-11 DIAGNOSIS — R7401 Elevation of levels of liver transaminase levels: Secondary | ICD-10-CM

## 2019-12-11 DIAGNOSIS — Z13220 Encounter for screening for lipoid disorders: Secondary | ICD-10-CM

## 2019-12-11 LAB — CBC
HCT: 38.1 % (ref 36.0–46.0)
Hemoglobin: 12.7 g/dL (ref 12.0–15.0)
MCHC: 33.4 g/dL (ref 30.0–36.0)
MCV: 90.5 fl (ref 78.0–100.0)
Platelets: 396 10*3/uL (ref 150.0–400.0)
RBC: 4.21 Mil/uL (ref 3.87–5.11)
RDW: 14.9 % (ref 11.5–15.5)
WBC: 6.9 10*3/uL (ref 4.0–10.5)

## 2019-12-11 LAB — COMPREHENSIVE METABOLIC PANEL
ALT: 123 U/L — ABNORMAL HIGH (ref 0–35)
AST: 62 U/L — ABNORMAL HIGH (ref 0–37)
Albumin: 4.4 g/dL (ref 3.5–5.2)
Alkaline Phosphatase: 76 U/L (ref 39–117)
BUN: 11 mg/dL (ref 6–23)
CO2: 27 mEq/L (ref 19–32)
Calcium: 10.1 mg/dL (ref 8.4–10.5)
Chloride: 101 mEq/L (ref 96–112)
Creatinine, Ser: 0.86 mg/dL (ref 0.40–1.20)
GFR: 73.43 mL/min (ref >60.00)
Glucose, Bld: 91 mg/dL (ref 70–99)
Potassium: 4.8 mEq/L (ref 3.5–5.1)
Sodium: 134 mEq/L — ABNORMAL LOW (ref 135–145)
Total Bilirubin: 0.7 mg/dL (ref 0.2–1.2)
Total Protein: 7.1 g/dL (ref 6.0–8.3)

## 2019-12-11 LAB — LIPID PANEL
Cholesterol: 192 mg/dL (ref 0–200)
HDL: 67.1 mg/dL (ref >39.00)
LDL Cholesterol: 107 mg/dL — ABNORMAL HIGH (ref 0–99)
NonHDL: 124.48
Total CHOL/HDL Ratio: 3
Triglycerides: 88 mg/dL (ref 0.0–149.0)
VLDL: 17.6 mg/dL (ref 0.0–40.0)

## 2019-12-11 LAB — HEMOGLOBIN A1C: Hgb A1c MFr Bld: 5.1 % (ref 4.6–6.5)

## 2019-12-11 LAB — TSH: TSH: 1.6 u[IU]/mL (ref 0.35–4.50)

## 2019-12-11 MED ORDER — QVAR REDIHALER 40 MCG/ACT IN AERB: 1.0000 | INHALATION_SPRAY | Freq: Two times a day (BID) | RESPIRATORY_TRACT | 9 refills | Status: DC

## 2019-12-11 MED FILL — QVAR REDIHALER 40 MCG/ACT A: 40 | 60 days supply | Qty: 11 | Fill #0

## 2019-12-11 NOTE — Addendum Note (Signed)
Addended by: Lamar Blinks C on: 12/11/2019 04:00 PM   Modules accepted: Orders

## 2019-12-11 NOTE — Patient Instructions (Addendum)
Great to see you again today!  We will start you on Qvar 1 puff twice a day- continue albuterol as needed You can increase qvar to 2 puffs after a week or so if needed to control your asthma sx First mammo at 39 years old or now if you prefer Please consider seeing derm for a skin check at your convenience I will touch base with Dr D about any necessary follow-up and will ask about your continuing baby asa   Health Maintenance, Female Adopting a healthy lifestyle and getting preventive care are important in promoting health and wellness. Ask your health care provider about:  The right schedule for you to have regular tests and exams.  Things you can do on your own to prevent diseases and keep yourself healthy. What should I know about diet, weight, and exercise? Eat a healthy diet   Eat a diet that includes plenty of vegetables, fruits, low-fat dairy products, and lean protein.  Do not eat a lot of foods that are high in solid fats, added sugars, or sodium. Maintain a healthy weight Body mass index (BMI) is used to identify weight problems. It estimates body fat based on height and weight. Your health care provider can help determine your BMI and help you achieve or maintain a healthy weight. Get regular exercise Get regular exercise. This is one of the most important things you can do for your health. Most adults should:  Exercise for at least 150 minutes each week. The exercise should increase your heart rate and make you sweat (moderate-intensity exercise).  Do strengthening exercises at least twice a week. This is in addition to the moderate-intensity exercise.  Spend less time sitting. Even light physical activity can be beneficial. Watch cholesterol and blood lipids Have your blood tested for lipids and cholesterol at 39 years of age, then have this test every 5 years. Have your cholesterol levels checked more often if:  Your lipid or cholesterol levels are high.  You are  older than 39 years of age.  You are at high risk for heart disease. What should I know about cancer screening? Depending on your health history and family history, you may need to have cancer screening at various ages. This may include screening for:  Breast cancer.  Cervical cancer.  Colorectal cancer.  Skin cancer.  Lung cancer. What should I know about heart disease, diabetes, and high blood pressure? Blood pressure and heart disease  High blood pressure causes heart disease and increases the risk of stroke. This is more likely to develop in people who have high blood pressure readings, are of African descent, or are overweight.  Have your blood pressure checked: ? Every 3-5 years if you are 36-49 years of age. ? Every year if you are 29 years old or older. Diabetes Have regular diabetes screenings. This checks your fasting blood sugar level. Have the screening done:  Once every three years after age 52 if you are at a normal weight and have a low risk for diabetes.  More often and at a younger age if you are overweight or have a high risk for diabetes. What should I know about preventing infection? Hepatitis B If you have a higher risk for hepatitis B, you should be screened for this virus. Talk with your health care provider to find out if you are at risk for hepatitis B infection. Hepatitis C Testing is recommended for:  Everyone born from [birth year] through 1965.  Anyone with known risk  factors for hepatitis C. Sexually transmitted infections (STIs)  Get screened for STIs, including gonorrhea and chlamydia, if: ? You are sexually active and are younger than 39 years of age. ? You are older than 39 years of age and your health care provider tells you that you are at risk for this type of infection. ? Your sexual activity has changed since you were last screened, and you are at increased risk for chlamydia or gonorrhea. Ask your health care provider if you are at  risk.  Ask your health care provider about whether you are at high risk for HIV. Your health care provider may recommend a prescription medicine to help prevent HIV infection. If you choose to take medicine to prevent HIV, you should first get tested for HIV. You should then be tested every 3 months for as long as you are taking the medicine. Pregnancy  If you are about to stop having your period (premenopausal) and you may become pregnant, seek counseling before you get pregnant.  Take 400 to 800 micrograms (mcg) of folic acid every day if you become pregnant.  Ask for birth control (contraception) if you want to prevent pregnancy. Osteoporosis and menopause Osteoporosis is a disease in which the bones lose minerals and strength with aging. This can result in bone fractures. If you are 31 years old or older, or if you are at risk for osteoporosis and fractures, ask your health care provider if you should:  Be screened for bone loss.  Take a calcium or vitamin D supplement to lower your risk of fractures.  Be given hormone replacement therapy (HRT) to treat symptoms of menopause. Follow these instructions at home: Lifestyle  Do not use any products that contain nicotine or tobacco, such as cigarettes, e-cigarettes, and chewing tobacco. If you need help quitting, ask your health care provider.  Do not use street drugs.  Do not share needles.  Ask your health care provider for help if you need support or information about quitting drugs. Alcohol use  Do not drink alcohol if: ? Your health care provider tells you not to drink. ? You are pregnant, may be pregnant, or are planning to become pregnant.  If you drink alcohol: ? Limit how much you use to 0-1 drink a day. ? Limit intake if you are breastfeeding.  Be aware of how much alcohol is in your drink. In the [place] one drink equals one 12 oz bottle of beer (355 mL), one 5 oz glass of wine (148 mL), or one 1 oz glass of hard liquor  (44 mL). General instructions  Schedule regular health, dental, and eye exams.  Stay current with your vaccines.  Tell your health care provider if: ? You often feel depressed. ? You have ever been abused or do not feel safe at home. Summary  Adopting a healthy lifestyle and getting preventive care are important in promoting health and wellness.  Follow your health care provider's instructions about healthy diet, exercising, and getting tested or screened for diseases.  Follow your health care provider's instructions on monitoring your cholesterol and blood pressure. This information is not intended to replace advice given to you by your health care provider. Make sure you discuss any questions you have with your health care provider. Document Revised: 08/29/2018 Document Reviewed: 08/29/2018 Elsevier Patient Education  2020 [employer].

## 2019-12-17 ENCOUNTER — Encounter: Payer: Self-pay | Admitting: Family Medicine

## 2019-12-17 DIAGNOSIS — Z13220 Encounter for screening for lipoid disorders: Secondary | ICD-10-CM

## 2019-12-17 DIAGNOSIS — Z5181 Encounter for therapeutic drug level monitoring: Secondary | ICD-10-CM

## 2019-12-19 ENCOUNTER — Ambulatory Visit: Payer: No Typology Code available for payment source | Attending: Internal Medicine

## 2019-12-19 DIAGNOSIS — Z23 Encounter for immunization: Secondary | ICD-10-CM

## 2019-12-19 NOTE — Progress Notes (Signed)
   Covid-19 Vaccination Clinic  Name:  Melissa Owens    MRN: [id number] DOB: [birth year]  12/19/2019  [name] was observed post Covid-19 immunization for 15 minutes without incident. She was provided with Vaccine Information Sheet and instruction to access the V-Safe system.   [name] was instructed to call 911 with any severe reactions post vaccine: Marland Kitchen Difficulty breathing  . Swelling of face and throat  . A fast heartbeat  . A bad rash all over body  . Dizziness and weakness   Immunizations Administered    Name Date Dose VIS Date Route   Pfizer COVID-19 Vaccine 12/19/2019  2:44 PM 0.3 mL 08/30/2019 Intramuscular   Manufacturer: [place]   Lot: [id number]   [place]: [id number]

## 2020-01-03 IMAGING — CT CT ANGIOGRAPHY HEAD
1 of 14 series · 5 of 35 positions shown · IV contrast (APPLIED)
Comparison: CTA neck 08/26/2018, MRI head and MRA neck 08/26/2018.
Catheter angiogram 08/30/2018

CLINICAL DATA: History of distal right vertebral artery dissection.
Now with left neck pain and headache

EXAM:
CT ANGIOGRAPHY HEAD AND NECK
TECHNIQUE: Multidetector CT imaging of the head and neck was performed using
the standard protocol during bolus administration of intravenous
contrast. Multiplanar CT image reconstructions and MIPs were
obtained to evaluate the vascular anatomy. Carotid stenosis
measurements (when applicable) are obtained utilizing NASCET
criteria, using the distal internal carotid diameter as the
denominator.
CONTRAST:  100mL OMNIPAQUE IOHEXOL 350 MG/ML SOLN

[Series 11: axial thin · axial · 0.49mm/px · z∈[-257,-29]mm · 5 of 342 slices shown]
[im 57/342  soft-tissue]
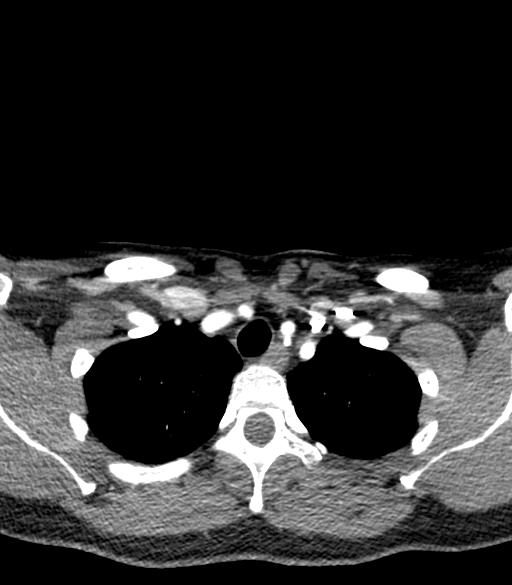
[im 114/342  bone]
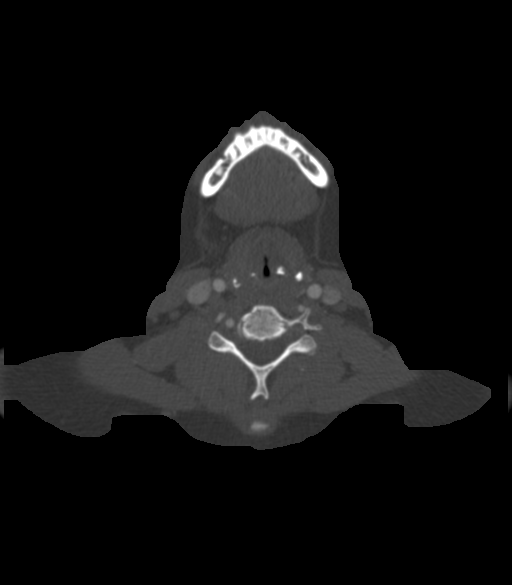
[im 171/342  soft-tissue]
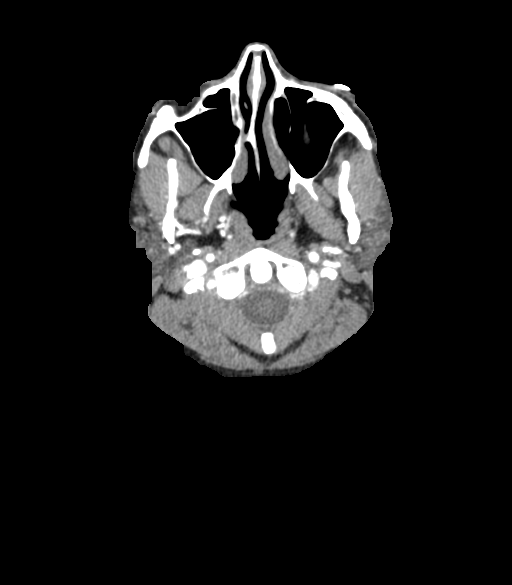
[im 228/342  bone]
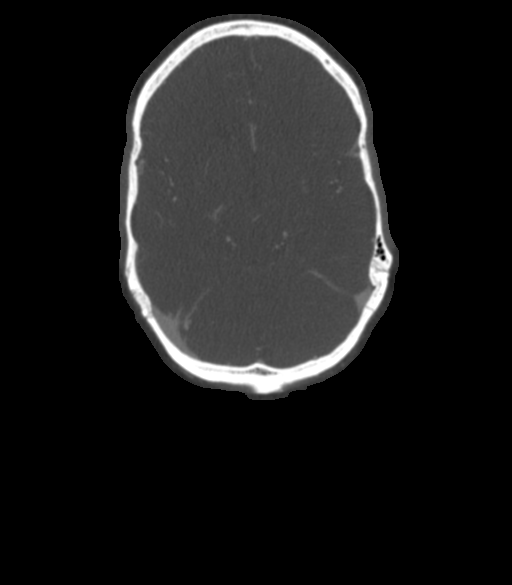
[im 285/342  soft-tissue]
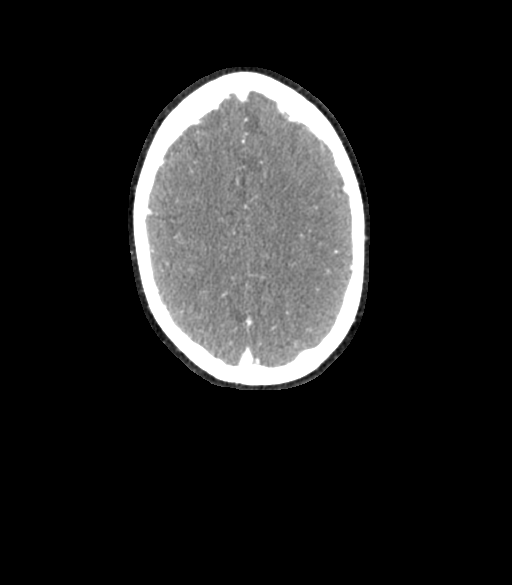

[5 of 35 positions shown; findings below may reference images not displayed]

FINDINGS: CT HEAD FINDINGS

Brain: No evidence of acute infarction, hemorrhage, hydrocephalus,
extra-axial collection or mass lesion/mass effect.

Vascular: Negative for hyperdense vessel

Skull: Negative

Sinuses: Negative

Orbits: Negative

Review of the MIP images confirms the above findings

CTA NECK FINDINGS

Aortic arch: Normal aortic arch without aneurysm dissection or
atherosclerotic disease. 4 vessel arch. Left vertebral artery origin
from the arch.

Right carotid system: Normal right carotid system. No
atherosclerotic disease stenosis or dissection

Left carotid system: Normal left carotid system. No atherosclerotic
disease stenosis or dissection

Vertebral arteries: Right vertebral artery is patent to the basilar.
Previously noted irregularity and stenosis at the C1 level has
resolved compatible with healed dissection. No aneurysm or stenosis
is present on the current study.

Left vertebral artery is non dominant and widely patent. No stenosis
or dissection in the left vertebral artery. This vessel was normal
previously.

Skeleton: Negative

Other neck: Negative for mass or adenopathy

Upper chest: Negative

Review of the MIP images confirms the above findings

CTA HEAD FINDINGS

Anterior circulation: Cavernous carotid widely patent without
stenosis or aneurysm. Anterior and middle cerebral arteries patent.
Hypoplastic right A1 segment. Both anterior cerebral arteries
supplied from the left. Negative for aneurysm.

Posterior circulation: Both vertebral arteries patent to the
basilar. PICA patent bilaterally. Basilar widely patent. AICA,
superior cerebellar, and posterior cerebral arteries patent without
stenosis or aneurysm

Venous sinuses: Patent

Anatomic variants: None

Delayed phase: Normal enhancement on delayed imaging

Review of the MIP images confirms the above findings
IMPRESSION: 1. Compared with the prior CTA of 08/26/2018, there has been
interval healing of dissection of distal right vertebral artery. No
residual stenosis or aneurysm.
2. Normal left vertebral artery without dissection. Normal carotid
arteries bilaterally
3. Normal intracranial circulation.
4. Negative CT head

## 2020-01-06 ENCOUNTER — Encounter: Payer: Self-pay | Admitting: Family Medicine

## 2020-01-06 ENCOUNTER — Other Ambulatory Visit: Payer: Self-pay

## 2020-01-06 ENCOUNTER — Other Ambulatory Visit: Payer: Self-pay | Admitting: Family Medicine

## 2020-01-06 ENCOUNTER — Other Ambulatory Visit (INDEPENDENT_AMBULATORY_CARE_PROVIDER_SITE_OTHER): Payer: No Typology Code available for payment source

## 2020-01-06 DIAGNOSIS — Z13220 Encounter for screening for lipoid disorders: Secondary | ICD-10-CM

## 2020-01-06 DIAGNOSIS — Z5181 Encounter for therapeutic drug level monitoring: Secondary | ICD-10-CM

## 2020-01-06 DIAGNOSIS — R7401 Elevation of levels of liver transaminase levels: Secondary | ICD-10-CM

## 2020-01-06 LAB — LIPID PANEL
Cholesterol: 252 mg/dL — ABNORMAL HIGH (ref 0–200)
HDL: 72.1 mg/dL (ref >39.00)
LDL Cholesterol: 158 mg/dL — ABNORMAL HIGH (ref 0–99)
NonHDL: 179.94
Total CHOL/HDL Ratio: 3
Triglycerides: 111 mg/dL (ref 0.0–149.0)
VLDL: 22.2 mg/dL (ref 0.0–40.0)

## 2020-01-06 LAB — HEPATIC FUNCTION PANEL
ALT: 45 U/L — ABNORMAL HIGH (ref 0–35)
AST: 28 U/L (ref 0–37)
Albumin: 4.8 g/dL (ref 3.5–5.2)
Alkaline Phosphatase: 82 U/L (ref 39–117)
Bilirubin, Direct: 0.1 mg/dL (ref 0.0–0.3)
Total Bilirubin: 0.6 mg/dL (ref 0.2–1.2)
Total Protein: 7.7 g/dL (ref 6.0–8.3)

## 2020-01-07 LAB — HEPATITIS PANEL, ACUTE
Hep A IgM: NONREACTIVE
Hep B C IgM: NONREACTIVE
Hepatitis B Surface Ag: NONREACTIVE
Hepatitis C Ab: NONREACTIVE
SIGNAL TO CUT-OFF: 0.02 (ref <1.00)

## 2020-01-13 ENCOUNTER — Ambulatory Visit: Payer: No Typology Code available for payment source | Attending: Internal Medicine

## 2020-01-13 DIAGNOSIS — Z23 Encounter for immunization: Secondary | ICD-10-CM

## 2020-01-13 NOTE — Progress Notes (Signed)
   Covid-19 Vaccination Clinic  Name:  Melissa Owens    MRN: [id number] DOB: 06-May-1981  01/13/2020  Melissa Owens was observed post Covid-19 immunization for 15 minutes without incident. She was provided with Vaccine Information Sheet and instruction to access the V-Safe system.   Melissa Owens was instructed to call 911 with any severe reactions post vaccine: Marland Kitchen Difficulty breathing  . Swelling of face and throat  . A fast heartbeat  . A bad rash all over body  . Dizziness and weakness   Immunizations Administered    Name Date Dose VIS Date Route   Pfizer COVID-19 Vaccine 01/13/2020 10:36 AM 0.3 mL 11/13/2018 Intramuscular   Manufacturer: [place]   Lot: [id number]   [place]: [id number]

## 2020-01-14 ENCOUNTER — Encounter: Payer: Self-pay | Admitting: Family Medicine

## 2020-01-15 NOTE — Progress Notes (Addendum)
[place] at [facility] [address], [place], [place] [address] [phone]  Date:  01/16/2020   Name:  Melissa Owens   DOB:  [birth year]   MRN:  [id number]  PCP:  Darreld Mclean, MD    Chief Complaint: Toe Injury (right two toes, couple weeks, bruising, sweling)   History of Present Illness:  Melissa Owens is a 39 y.o. 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  The weight hit her 4th and 5th toes- swelling and bruising was present.  Her pain has gotten somewhat better, but still continues to bother her.  She wanted to have it x-rayed to make sure no fracture She is having pain with walking on her treadmill or on any soft surface  No chance of current pregnancy.  She otherwise feels well  We recheck elevated LFTs about 10 days ago, much improved  Patient Active Problem List   Diagnosis Date Noted  . Family history of narcolepsy 02/18/2019  . Excessive daytime sleepiness 02/18/2019  . 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 Seasonal affective disorder ([place]) 11/21/2017  . 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 Exercise-induced asthma 11/21/2017    Past Medical History:  Diagnosis Date  . Chicken pox   . Exercise-induced asthma 11/21/2017  . High cholesterol   . Migraines   . Urinary tract infection   . Vertebral artery dissection ([place]) 08/2018   resolved    Past Surgical History:  Procedure Laterality Date  . CESAREAN SECTION  2009  . IR ANGIO INTRA EXTRACRAN SEL COM CAROTID INNOMINATE BILAT MOD SED  08/30/2018  . IR ANGIO VERTEBRAL SEL VERTEBRAL BILAT MOD SED  08/30/2018  . WISDOM TOOTH EXTRACTION      Social History   Tobacco Use  . Smoking status: Never Smoker   . Smokeless tobacco: Never Used  Substance Use Topics  . Alcohol use: Yes    Alcohol/week: 1.0 - 2.0 standard drinks    Types: 1 - 2 Glasses of wine per week  . Drug use: Never    Family History  Problem Relation Age of Onset  . Hypertension Mother   . High Cholesterol Mother   . Cancer Father   . Heart attack Father   . Heart disease Father   . Arthritis Maternal Grandmother   . Cancer Maternal Grandmother   . High Cholesterol Maternal Grandmother   . High blood pressure Maternal Grandmother   . Stroke Maternal Grandmother   . Arthritis Maternal Grandfather   . Diabetes Maternal Grandfather   . Hearing loss Maternal Grandfather   . High Cholesterol Maternal Grandfather   . High blood pressure Maternal Grandfather   . Asthma Paternal Grandmother   . Diabetes Paternal Grandmother   . Miscarriages / Stillbirths Paternal Grandmother   . COPD Paternal Grandfather   . Alcohol abuse Brother   . Drug abuse Brother     Allergies  Allergen Reactions  . Sulfa Antibiotics Hives    Medication list has been reviewed and updated.  Current Outpatient Medications on File Prior to Visit  Medication Sig Dispense Refill  . albuterol (VENTOLIN HFA) 108 (90 Base) MCG/ACT inhaler Inhale 1-2 puffs into the lungs every 4 (four) hours as  needed for wheezing or shortness of breath. 18 Inhaler 1  . aspirin EC 81 MG tablet Take 81 mg by mouth daily.    . beclomethasone (QVAR REDIHALER) 40 MCG/ACT inhaler Inhale 1 puff into the lungs 2 (two) times daily. 10.6 g 9  . modafinil (PROVIGIL) 200 MG tablet TAKE 1 TABLET (200 MG TOTAL) BY MOUTH DAILY. 30 tablet 5  . Multiple Vitamins-Minerals (WOMENS MULTIVITAMIN PO) Take 1 tablet by mouth daily.    . rosuvastatin (CRESTOR) 5 MG tablet Take 0.5 tablets (2.5 mg total) by mouth daily. 45 tablet 3   Current Facility-Administered Medications on File Prior to Visit  Medication Dose Route Frequency Provider Last Rate Last Admin  . ibuprofen (ADVIL,MOTRIN)  tablet 600 mg  600 mg Oral Once Jemya Depierro, Gay Filler, MD        Review of Systems:  As per HPI- otherwise negative.   Physical Examination: Vitals:   01/16/20 0845  BP: 131/66  Pulse: 74  Resp: 16  Temp: 98 F (36.7 C)  SpO2: 100%   Vitals:   01/16/20 0845  Weight: 129 lb (58.5 kg)  Height: 5\' 2"  (1.575 m)   Body mass index is 23.59 kg/m. Ideal Body Weight: Weight in (lb) to have BMI = 25: 136.4  GEN: no acute distress.  Normal weight, looks well  HEENT: Atraumatic, Normocephalic.  Ears and Nose: No external deformity. CV: RRR, No M/G/R. No JVD. No thrill. No extra heart sounds. PULM: CTA B, no wheezes, crackles, rhonchi. No retractions. No resp. distress. No accessory muscle use. EXTR: No c/c/e PSYCH: Normally interactive. Conversant.  Right foot: mild tenderness at the distal 4th MT, mild tenderness with pressure on the 4th and 5th toes. No bruise, redness or wound.   No swelling  Pt shows me a photo of her foot at the time of injury- at that time bruising present over distal 4th/ 5th MT and toes   Assessment and Plan: Contusion of fourth toe of right foot, initial encounter - Plan: DG Foot Complete Right  Recent injury of right foot, eval for possible fracture of toe or metatarsal. Ordered plain films today, pt will do at her convenience and then I will be in touch with her and plan next step accordingly   This visit occurred during the SARS-CoV-2 public health emergency.  Safety protocols were in place, including screening questions prior to the visit, additional usage of staff PPE, and extensive cleaning of exam room while observing appropriate contact time as indicated for disinfecting solutions.     Signed Lamar Blinks, MD  Received her x-ray report as follows, message to patient  DG Foot Complete Right  Result Date: 01/16/2020 CLINICAL DATA:  Right foot pain after injury. EXAM: RIGHT FOOT COMPLETE - 3+ VIEW COMPARISON:  None. FINDINGS: There appears to  be probable mildly displaced fracture involving the fifth middle phalanx. No other bony abnormality is noted. No soft tissue abnormality is noted. IMPRESSION: Probable mildly displaced fifth middle phalangeal fracture. Electronically Signed   By: Marijo Conception M.D.   On: 01/16/2020 14:32

## 2020-01-16 ENCOUNTER — Ambulatory Visit
Admission: RE | Admit: 2020-01-16 | Discharge: 2020-01-16 | Disposition: A | Discharge status: Home / Self Care | Payer: No Typology Code available for payment source | Source: Ambulatory Visit | Attending: Family Medicine | Admitting: Family Medicine

## 2020-01-16 ENCOUNTER — Other Ambulatory Visit: Payer: Self-pay

## 2020-01-16 ENCOUNTER — Ambulatory Visit (INDEPENDENT_AMBULATORY_CARE_PROVIDER_SITE_OTHER): Payer: No Typology Code available for payment source | Admitting: Family Medicine

## 2020-01-16 ENCOUNTER — Encounter: Payer: Self-pay | Admitting: Family Medicine

## 2020-01-16 VITALS — BP 131/66 | HR 74 | Temp 98.0°F | Resp 16 | Ht 62.0 in | Wt 129.0 lb

## 2020-01-16 DIAGNOSIS — S90121A Contusion of right lesser toe(s) without damage to nail, initial encounter: Secondary | ICD-10-CM

## 2020-01-24 MED FILL — MODAFINIL 200 MG TABLET: 200 | 30 days supply | Qty: 30 | Fill #1

## 2020-01-29 ENCOUNTER — Other Ambulatory Visit: Payer: Self-pay | Admitting: Family Medicine

## 2020-03-30 MED FILL — MODAFINIL 200 MG TABS: 200 | 30 days supply | Qty: 30 | Fill #3

## 2020-05-04 MED FILL — MODAFINIL 200 MG TABS: 200 | 30 days supply | Qty: 30 | Fill #4

## 2020-05-10 ENCOUNTER — Encounter: Payer: Self-pay | Admitting: Family Medicine

## 2020-05-11 MED FILL — ROSUVASTATIN CALCIUM 5 MG T: 5 | 90 days supply | Qty: 45 | Fill #1

## 2020-06-02 ENCOUNTER — Other Ambulatory Visit: Payer: Self-pay | Admitting: Neurology

## 2020-06-19 ENCOUNTER — Telehealth: Payer: Self-pay | Admitting: Family Medicine

## 2020-06-19 NOTE — Telephone Encounter (Signed)
Caller name: [place] Call back number: [phone]  Patient states she is having pain in her right calf. Patient describes the pain as a cramp, tightness, no redness, is not warm to the touch. We have no available appointments for today. I gave her our first available appointment on 06/22/20. She would like some advice.

## 2020-06-19 NOTE — Telephone Encounter (Signed)
No injury known.  She feels fine unless she is pushing off the affected calf.  We discussed- offered to get her an [place] before our appt next week.  She prefers to see me on Monday, will contact me if getting worse

## 2020-06-19 NOTE — Telephone Encounter (Signed)
Pt has noted a localized pain in her right calf with walking only for 5 days.

## 2020-06-20 NOTE — Progress Notes (Signed)
[place] at [facility] [address] [place], [place] [address] [phone]  Date:  06/22/2020   Name:  Melissa Owens   DOB:  1980-10-15   MRN:  [id number]  PCP:  Darreld Mclean, MD    Chief Complaint: Calf Pain (right calf pain, no injury)   History of Present Illness:  Melissa Owens is a 39 y.o. very pleasant female patient who presents with the following:  Melissa Owens is here today with concern of possible calf muscle Injury- has noted right calf pain for about one week She had contacted me last week after noticing pain in her right calf which would occur only when pushing off with that foot, such as when taking a step She is not aware of any injury- sx came out of the blue Walking is uncomfortable, at rest she feels fine  No CP or SOB, no hemoptysis  No recent immobilization, no history of blood clots  She is generally healthy, she did have a vertebral artery dissection in 2019 which was healed on repeat CT angio in 2020  Flu vaccine- today  COVID-19 series complete Pap- done per [facility]   Patient Active Problem List   Diagnosis Date Noted  . Family history of narcolepsy 02/18/2019  . Excessive daytime sleepiness 02/18/2019  . 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 Seasonal affective disorder ([place]) 11/21/2017  . 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 Exercise-induced asthma 11/21/2017    Past Medical History:  Diagnosis Date  . Chicken pox   . Exercise-induced asthma 11/21/2017  . High cholesterol   . Migraines   . Urinary tract infection   . Vertebral artery dissection ([place]) 08/2018   resolved    Past Surgical History:  Procedure Laterality Date  . CESAREAN SECTION  2009  . IR ANGIO INTRA EXTRACRAN SEL COM CAROTID INNOMINATE BILAT MOD SED  08/30/2018  . IR ANGIO VERTEBRAL SEL VERTEBRAL BILAT MOD SED  08/30/2018  . WISDOM TOOTH EXTRACTION      Social History   Tobacco Use  . Smoking status:  Never Smoker  . Smokeless tobacco: Never Used  Substance Use Topics  . Alcohol use: Yes    Alcohol/week: 1.0 - 2.0 standard drink    Types: 1 - 2 Glasses of wine per week  . Drug use: Never    Family History  Problem Relation Age of Onset  . Hypertension Mother   . High Cholesterol Mother   . Cancer Father   . Heart attack Father   . Heart disease Father   . Arthritis Maternal Grandmother   . Cancer Maternal Grandmother   . High Cholesterol Maternal Grandmother   . High blood pressure Maternal Grandmother   . Stroke Maternal Grandmother   . Arthritis Maternal Grandfather   . Diabetes Maternal Grandfather   . Hearing loss Maternal Grandfather   . High Cholesterol Maternal Grandfather   . High blood pressure Maternal Grandfather   . Asthma Paternal Grandmother   . Diabetes Paternal Grandmother   . Miscarriages / Stillbirths Paternal Grandmother   . COPD Paternal Grandfather   . Alcohol abuse Brother   . Drug abuse Brother     Allergies  Allergen Reactions  . Sulfa Antibiotics Hives    Medication list has been reviewed and updated.  Current Outpatient Medications on File Prior to Visit  Medication Sig Dispense Refill  . albuterol (VENTOLIN HFA) 108 (90 Base) MCG/ACT inhaler Inhale 1-2 puffs  into the lungs every 4 (four) hours as needed for wheezing or shortness of breath. 18 Inhaler 1  . aspirin EC 81 MG tablet Take 81 mg by mouth daily.    . modafinil (PROVIGIL) 200 MG tablet TAKE 1 TABLET (200 MG TOTAL) BY MOUTH DAILY. 30 tablet 5  . Multiple Vitamins-Minerals (WOMENS MULTIVITAMIN PO) Take 1 tablet by mouth daily.    . rosuvastatin (CRESTOR) 5 MG tablet TAKE 1/2 TABLET (2.5 MG TOTAL) BY MOUTH DAILY. 45 tablet 3   Current Facility-Administered Medications on File Prior to Visit  Medication Dose Route Frequency Provider Last Rate Last Admin  . ibuprofen (ADVIL,MOTRIN) tablet 600 mg  600 mg Oral Once Maddie Brazier, Gay Filler, MD        Review of Systems:  As per HPI-  otherwise negative.   Physical Examination: Vitals:   06/22/20 0908  BP: 122/80  Pulse: 77  Resp: 16  SpO2: 99%   Vitals:   06/22/20 0908  Weight: 132 lb (59.9 kg)  Height: 5\' 2"  (1.575 m)   Body mass index is 24.14 kg/m. Ideal Body Weight: Weight in (lb) to have BMI = 25: 136.4  GEN: no acute distress.  Normal weight, looks well  HEENT: Atraumatic, Normocephalic.  Ears and Nose: No external deformity. CV: RRR, No M/G/R. No JVD. No thrill. No extra heart sounds. PULM: CTA B, no wheezes, crackles, rhonchi. No retractions. No resp. distress. No accessory muscle use. EXTR: No c/c/e PSYCH: Normally interactive. Conversant.  No apparent swelling of the calves Normal pedal pulses bilaterally No significant tenderness of either leg Normal gait and bilateral calf raise   Assessment and Plan: Right calf pain - Plan: US Venous Img Lower Unilateral Right  Needs flu shot - Plan: Flu Vaccine QUAD 6+ mos PF IM (Fluarix Quad PF)  Melissa Owens is here today with right calf pain for about 1 week.  Suspect that she has a small muscle tear, but will do an ultrasound to rule out clot Ultrasound scheduled for later today, will be in touch with her pending those results Assuming she does not have a clot, discussed conservative management of a calf tear including activity modification, compression.  If continues to be bothersome we can refer her to sports This visit occurred during the SARS-CoV-2 public health emergency.  Safety protocols were in place, including screening questions prior to the visit, additional usage of staff PPE, and extensive cleaning of exam room while observing appropriate contact time as indicated for disinfecting solutions.    Signed Lamar Blinks, MD

## 2020-06-22 ENCOUNTER — Other Ambulatory Visit: Payer: Self-pay

## 2020-06-22 ENCOUNTER — Encounter: Payer: Self-pay | Admitting: Family Medicine

## 2020-06-22 ENCOUNTER — Ambulatory Visit (INDEPENDENT_AMBULATORY_CARE_PROVIDER_SITE_OTHER): Payer: No Typology Code available for payment source | Admitting: Family Medicine

## 2020-06-22 ENCOUNTER — Ambulatory Visit
Admission: RE | Admit: 2020-06-22 | Discharge: 2020-06-22 | Disposition: A | Discharge status: Home / Self Care | Payer: No Typology Code available for payment source | Source: Ambulatory Visit | Attending: Family Medicine | Admitting: Family Medicine

## 2020-06-22 VITALS — BP 122/80 | HR 77 | Resp 16 | Ht 62.0 in | Wt 132.0 lb

## 2020-06-22 DIAGNOSIS — M79661 Pain in right lower leg: Secondary | ICD-10-CM | POA: Diagnosis not present

## 2020-06-22 DIAGNOSIS — Z23 Encounter for immunization: Secondary | ICD-10-CM | POA: Diagnosis not present

## 2020-06-22 NOTE — Patient Instructions (Addendum)
Please go to [facility] today for your leg ultrasound- I will be in touch with your result Assuming you do not have a clot, you likely have a small muscle tear Modify your activity and let me know if the calf is not feeling better in a week or two A calf compression sleeve may be helpful   [place] today at [place] [facility]- arrive at 3:15

## 2020-07-03 NOTE — Addendum Note (Signed)
Addended by: Kelle Darting A on: 07/03/2020 03:18 PM   Modules accepted: Orders

## 2020-07-07 ENCOUNTER — Encounter: Payer: Self-pay | Admitting: Family Medicine

## 2020-07-07 ENCOUNTER — Other Ambulatory Visit (INDEPENDENT_AMBULATORY_CARE_PROVIDER_SITE_OTHER): Payer: No Typology Code available for payment source

## 2020-07-07 ENCOUNTER — Other Ambulatory Visit: Payer: Self-pay | Admitting: Family Medicine

## 2020-07-07 ENCOUNTER — Other Ambulatory Visit: Payer: Self-pay

## 2020-07-07 ENCOUNTER — Ambulatory Visit: Payer: No Typology Code available for payment source | Admitting: Family Medicine

## 2020-07-07 VITALS — BP 126/76 | HR 86 | Ht 63.5 in | Wt 134.0 lb

## 2020-07-07 DIAGNOSIS — Z13220 Encounter for screening for lipoid disorders: Secondary | ICD-10-CM | POA: Diagnosis not present

## 2020-07-07 DIAGNOSIS — G4711 Idiopathic hypersomnia with long sleep time: Secondary | ICD-10-CM

## 2020-07-07 DIAGNOSIS — Z5181 Encounter for therapeutic drug level monitoring: Secondary | ICD-10-CM

## 2020-07-07 MED ORDER — MODAFINIL 200 MG PO TABS: 200.0000 mg | ORAL_TABLET | Freq: Every day | ORAL | 1 refills | Status: DC

## 2020-07-07 MED FILL — MODAFINIL 200 MG TABLET: 200 | 90 days supply | Qty: 90 | Fill #0

## 2020-07-07 NOTE — Patient Instructions (Addendum)
We will continue modafinil 200mg  daily. Stay well hydrated. Well balanced diet and regular exercise encouraged.   Follow up in 1 year   Hypersomnia Hypersomnia is a condition in which a person feels very tired during the day even though he or she gets plenty of sleep at night. A person with this condition may take naps during the day and may find it very difficult to wake up from sleep. Hypersomnia may affect a person's ability to think, concentrate, drive, or remember things. What are the causes? The cause of this condition may not be known. Possible causes include:  Certain medicines.  Sleep disorders, such as narcolepsy and sleep apnea.  Injury to the head, brain, or spinal cord.  Drug or alcohol use.  Gastroesophageal reflux disease (GERD).  Tumors.  Certain medical conditions, such as depression, diabetes, or an underactive thyroid gland (hypothyroidism). What are the signs or symptoms? The main symptoms of hypersomnia include:  Feeling very tired throughout the day, regardless of how much sleep you got the night before.  Having trouble waking up. Others may find it difficult to wake you up when you are sleeping.  Sleeping for longer and longer periods at a time.  Taking naps throughout the day. Other symptoms may include:  Feeling restless, anxious, or annoyed.  Lacking energy.  Having trouble with: ? Remembering. ? Speaking. ? Thinking.  Loss of appetite.  Seeing, hearing, tasting, smelling, or feeling things that are not real (hallucinations). How is this diagnosed? This condition may be diagnosed based on:  Your symptoms and medical history.  Your sleeping habits. Your health care provider may ask you to write down your sleeping habits in a daily sleep log, along with any symptoms you have.  A series of tests that are done while you sleep (sleep study or polysomnogram).  A test that measures how quickly you can fall asleep during the day (daytime nap  study or multiple sleep latency test). How is this treated? Treatment can help you manage your condition. Treatment may include:  Following a regular sleep routine.  Lifestyle changes, such as changing your eating habits, getting regular exercise, and avoiding alcohol or caffeinated beverages.  Taking medicines to make you more alert (stimulants) during the day.  Treating any underlying medical causes of hypersomnia. Follow these instructions at home: Sleep routine   Schedule the same bedtime and wake-up time each day.  Practice a relaxing bedtime routine. This may include reading, meditation, deep breathing, or taking a warm bath before going to sleep.  Get regular exercise each day. Avoid strenuous exercise in the evening hours.  Keep your sleep environment at a cooler temperature, darkened, and quiet.  Sleep with pillows and a mattress that are comfortable and supportive.  Schedule short 20-minute naps for when you feel sleepiest during the day.  Talk with your employer or teachers about your hypersomnia. If possible, adjust your schedule so that: ? You have a regular daytime work schedule. ? You can take a scheduled nap during the day. ? You do not have to work or be active at night.  Do not eat a heavy meal for a few hours before bedtime. Eat your meals at about the same times every day.  Avoid drinking alcohol or caffeinated beverages. Safety   Do not drive or use heavy machinery if you are sleepy. Ask your health care provider if it is safe for you to drive.  Wear a life jacket when swimming or spending time near water. General  instructions  Take supplements and over-the-counter and prescription medicines only as told by your health care provider.  Keep a sleep log that will help your doctor manage your condition. This may include information about: ? What time you go to bed each night. ? How often you wake up at night. ? How many hours you sleep at  night. ? How often and for how long you nap during the day. ? Any observations from others, such as leg movements during sleep, sleep walking, or snoring.  Keep all follow-up visits as told by your health care provider. This is important. Contact a health care provider if:  You have new symptoms.  Your symptoms get worse. Get help right away if:  You have serious thoughts about hurting yourself or someone else. If you ever feel like you may hurt yourself or others, or have thoughts about taking your own life, get help right away. You can go to your nearest emergency department or call:  Your local emergency services (911 in the [place]  A suicide crisis helpline, such as the [facility] at 954 119 5510. This is open 24 hours a day. Summary  Hypersomnia refers to a condition in which you feel very tired during the day even though you get plenty of sleep at night.  A person with this condition may take naps during the day and may find it very difficult to wake up from sleep.  Hypersomnia may affect a person's ability to think, concentrate, drive, or remember things.  Treatment, such as following a regular sleep routine and making some lifestyle changes, can help you manage your condition. This information is not intended to replace advice given to you by your health care provider. Make sure you discuss any questions you have with your health care provider. Document Revised: 09/07/2017 Document Reviewed: 09/07/2017 Elsevier Patient Education  2020 [employer].

## 2020-07-07 NOTE — Progress Notes (Signed)
Chief Complaint  Patient presents with  . Follow-up    rm 16  . Medication Management     HISTORY OF PRESENT ILLNESS: Today 07/07/20  Melissa Owens is a 39 y.o. female here today for follow up for idiopathic hypersomnolence.  Narcolepsy gene positive, hoever, MSLT showed "This multiple sleep latency test reveals a mean sleep latency of 15.8 minutes with 3 out of 5 nap opportunities during which no sleep was recorded. 2. There were no sleep periods during which REM sleep was recorded". She has continued modafinil 200mg . She feels that this definitely helps. Occasionally she will take 1/2 tablet if she isn't busy. She denies adverse effects. She is feeling well today. She had labs with PCP this morning.    HISTORY (copied from note on 02/18/2019)  HPI:  Melissa Owens is a 39 y.o. female  Is seen here as a referral from Dr. Lorelei Pont for sleep apnea evaluation.  Past Medical  History ; 3 healthy chr ildren, 1 biological and 2 adopted. No complications.  Married to Dr Owens Shark , Pharm D. 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 "     Family history: We always needed a lot of sleep in my family  My maternal grandmother fell asleep when laughing, cataplexy (?)> her brother shot himself after a vivid dream about intruders, injured his lag. Grandfather fell asleep within a minute when not active, not stimulated.    Social History: level of education college, no tobaco use in any form, and seldomly alcohol ,  Caffeine - one cup of coffee and one soda daily.  No shift work history.    Sleep habit: dinner time is between 6.30 and 7 PM - but bedtime is 10.30 PM, shares a cool, quiet and dark bedroom with husband, flat bed, 1 pillow and sleeps on her sides or back- prefers supine.  falls asleep quickly and goes once to bathroom break. She is known to talk in hr sleep, her husband has conversations she cannot  recall. Mostly when her husband follows her to bed at 12.00 midnight. She rises at 5.45 in AM  on school days, now at 7. AM, but feels not refreshed.  She naps every day after lunch , up to one hour.  Naps feel refreshing, for a time.      REVIEW OF SYSTEMS: Out of a complete 14 system review of symptoms, the patient complains only of the following symptoms, hypersomnolence and all other reviewed systems are negative.   ALLERGIES: Allergies  Allergen Reactions  . Sulfa Antibiotics Hives     HOME MEDICATIONS: Outpatient Medications Prior to Visit  Medication Sig Dispense Refill  . albuterol (VENTOLIN HFA) 108 (90 Base) MCG/ACT inhaler Inhale 1-2 puffs into the lungs every 4 (four) hours as needed for wheezing or shortness of breath. 18 Inhaler 1  . aspirin EC 81 MG tablet Take 81 mg by mouth daily.    . modafinil (PROVIGIL) 200 MG tablet TAKE 1 TABLET (200 MG TOTAL) BY MOUTH DAILY. 30 tablet 5  . Multiple Vitamins-Minerals (WOMENS MULTIVITAMIN PO) Take 1 tablet by mouth daily.    . rosuvastatin (CRESTOR) 5 MG tablet TAKE 1/2 TABLET (2.5 MG TOTAL) BY MOUTH DAILY. 45 tablet 3   Facility-Administered Medications Prior to Visit  Medication Dose Route Frequency Provider Last Rate Last Admin  . ibuprofen (ADVIL,MOTRIN) tablet 600 mg  600 mg Oral Once  Copland, Gay Filler, MD         PAST MEDICAL HISTORY: Past Medical History:  Diagnosis Date  . Chicken pox   . Exercise-induced asthma 11/21/2017  . High cholesterol   . Migraines   . Urinary tract infection   . Vertebral artery dissection ([place]) 08/2018   resolved     PAST SURGICAL HISTORY: Past Surgical History:  Procedure Laterality Date  . CESAREAN SECTION  2009  . IR ANGIO INTRA EXTRACRAN SEL COM CAROTID INNOMINATE BILAT MOD SED  08/30/2018  . IR ANGIO VERTEBRAL SEL VERTEBRAL BILAT MOD SED  08/30/2018  . WISDOM TOOTH EXTRACTION       FAMILY HISTORY: Family History  Problem Relation Age of Onset  . Hypertension Mother    . High Cholesterol Mother   . Cancer Father   . Heart attack Father   . Heart disease Father   . Arthritis Maternal Grandmother   . Cancer Maternal Grandmother   . High Cholesterol Maternal Grandmother   . High blood pressure Maternal Grandmother   . Stroke Maternal Grandmother   . Arthritis Maternal Grandfather   . Diabetes Maternal Grandfather   . Hearing loss Maternal Grandfather   . High Cholesterol Maternal Grandfather   . High blood pressure Maternal Grandfather   . Asthma Paternal Grandmother   . Diabetes Paternal Grandmother   . Miscarriages / Stillbirths Paternal Grandmother   . COPD Paternal Grandfather   . Alcohol abuse Brother   . Drug abuse Brother      SOCIAL HISTORY: Social History   Socioeconomic History  . Marital status: Married    Spouse name: Not on file  . Number of children: Not on file  . Years of education: Not on file  . Highest education level: Not on file  Occupational History  . Not on file  Tobacco Use  . Smoking status: Never Smoker  . Smokeless tobacco: Never Used  Substance and Sexual Activity  . Alcohol use: Yes    Alcohol/week: 1.0 - 2.0 standard drink    Types: 1 - 2 Glasses of wine per week  . Drug use: Never  . Sexual activity: Not on file  Other Topics Concern  . Not on file  Social History Narrative  . Not on file   Social Determinants of Health   Financial Resource Strain:   . Difficulty of Paying Living Expenses: Not on file  Food Insecurity:   . Worried About Charity fundraiser in the Last Year: Not on file  . Ran Out of Food in the Last Year: Not on file  Transportation Needs:   . Lack of Transportation (Medical): Not on file  . Lack of Transportation (Non-Medical): Not on file  Physical Activity:   . Days of Exercise per Week: Not on file  . Minutes of Exercise per Session: Not on file  Stress:   . Feeling of Stress : Not on file  Social Connections:   . Frequency of Communication with Friends and Family:  Not on file  . Frequency of Social Gatherings with Friends and Family: Not on file  . Attends Religious Services: Not on file  . Active Member of Clubs or Organizations: Not on file  . Attends Archivist Meetings: Not on file  . Marital Status: Not on file  Intimate Partner Violence:   . Fear of Current or Ex-Partner: Not on file  . Emotionally Abused: Not on file  . Physically Abused: Not on file  .  Sexually Abused: Not on file      PHYSICAL EXAM  There were no vitals filed for this visit. There is no height or weight on file to calculate BMI.   Generalized: Well developed, in no acute distress   Neurological examination  Mentation: Alert oriented to time, place, history taking. Follows all commands speech and language fluent Cranial nerve II-XII: Pupils were equal round reactive to light. Extraocular movements were full, visual field were full on confrontational test. Facial sensation and strength were normal. Uvula tongue midline. Head turning and shoulder shrug  were normal and symmetric. Motor: The motor testing reveals 5 over 5 strength of all 4 extremities. Good symmetric motor tone is noted throughout.  Sensory: Sensory testing is intact to soft touch on all 4 extremities. No evidence of extinction is noted.  Coordination: Cerebellar testing reveals good finger-nose-finger and heel-to-shin bilaterally.  Gait and station: Gait is normal.    DIAGNOSTIC DATA (LABS, IMAGING, TESTING) - I reviewed patient records, labs, notes, testing and imaging myself where available.  Lab Results  Component Value Date   WBC 6.9 12/11/2019   HGB 12.7 12/11/2019   HCT 38.1 12/11/2019   MCV 90.5 12/11/2019   PLT 396.0 12/11/2019      Component Value Date/Time   NA 134 (L) 12/11/2019 1001   NA 139 03/23/2017 0000   K 4.8 12/11/2019 1001   CL 101 12/11/2019 1001   CO2 27 12/11/2019 1001   GLUCOSE 91 12/11/2019 1001   BUN 11 12/11/2019 1001   BUN 9 03/23/2017 0000    CREATININE 0.86 12/11/2019 1001   CALCIUM 10.1 12/11/2019 1001   PROT 7.7 01/06/2020 0923   ALBUMIN 4.8 01/06/2020 0923   AST 28 01/06/2020 0923   ALT 45 (H) 01/06/2020 0923   ALKPHOS 82 01/06/2020 0923   BILITOT 0.6 01/06/2020 0923   GFRNONAA >60 08/26/2018 1350   GFRAA >60 08/26/2018 1350   Lab Results  Component Value Date   CHOL 252 (H) 01/06/2020   HDL 72.10 01/06/2020   LDLCALC 158 (H) 01/06/2020   TRIG 111.0 01/06/2020   CHOLHDL 3 01/06/2020   Lab Results  Component Value Date   HGBA1C 5.1 12/11/2019   No results found for: VITAMINB12 Lab Results  Component Value Date   TSH 1.60 12/11/2019      ASSESSMENT AND PLAN  39 y.o. year old female  has a past medical history of Chicken pox, Exercise-induced asthma (11/21/2017), High cholesterol, Migraines, Urinary tract infection, and Vertebral artery dissection ([place]) (08/2018). here with   Idiopathic hypersomnolence  Lailie is doing well on modafinil 200mg  daily. We will continue current treatment plan. PDMP reveals appropriate refills. Healthy lifestyle habits encouraged. She will follow up in 1 year, sooner if needed.    I spent 20 minutes of face-to-face and non-face-to-face time with patient.  This included previsit chart review, lab review, study review, order entry, electronic health record documentation, patient education.    Debbora Presto, MSN, FNP-C 07/07/2020, 1:35 PM  [facility] [address], [place], [place] [address] [phone]

## 2020-07-08 ENCOUNTER — Encounter: Payer: Self-pay | Admitting: Family Medicine

## 2020-07-08 ENCOUNTER — Other Ambulatory Visit: Payer: Self-pay | Admitting: Family Medicine

## 2020-07-08 LAB — HEPATIC FUNCTION PANEL
AG Ratio: 1.7 (calc) (ref 1.0–2.5)
ALT: 30 U/L — ABNORMAL HIGH (ref 6–29)
AST: 28 U/L (ref 10–30)
Albumin: 4.5 g/dL (ref 3.6–5.1)
Alkaline phosphatase (APISO): 84 U/L (ref 31–125)
Bilirubin, Direct: 0.1 mg/dL (ref 0.0–0.2)
Globulin: 2.6 g/dL (calc) (ref 1.9–3.7)
Indirect Bilirubin: 0.5 mg/dL (calc) (ref 0.2–1.2)
Total Bilirubin: 0.6 mg/dL (ref 0.2–1.2)
Total Protein: 7.1 g/dL (ref 6.1–8.1)

## 2020-07-08 LAB — LIPID PANEL
Cholesterol: 188 mg/dL (ref <200)
HDL: 63 mg/dL (ref >=50)
LDL Cholesterol (Calc): 104 mg/dL (calc) — ABNORMAL HIGH
Non-HDL Cholesterol (Calc): 125 mg/dL (calc) (ref <130)
Total CHOL/HDL Ratio: 3 (calc) (ref <5.0)
Triglycerides: 117 mg/dL (ref <150)

## 2020-07-08 MED ORDER — ROSUVASTATIN CALCIUM 5 MG PO TABS: 5.0000 mg | ORAL_TABLET | Freq: Every day | ORAL | 3 refills | Status: DC

## 2020-07-08 MED FILL — ROSUVASTATIN CALCIUM 5 MG T: 5 | 90 days supply | Qty: 90 | Fill #0

## 2020-08-24 LAB — HM PAP SMEAR: HM Pap smear: NEGATIVE

## 2020-09-14 ENCOUNTER — Other Ambulatory Visit (HOSPITAL_BASED_OUTPATIENT_CLINIC_OR_DEPARTMENT_OTHER): Payer: Self-pay | Admitting: Internal Medicine

## 2020-09-14 ENCOUNTER — Ambulatory Visit: Payer: No Typology Code available for payment source | Attending: Internal Medicine

## 2020-09-14 DIAGNOSIS — Z23 Encounter for immunization: Secondary | ICD-10-CM

## 2020-09-14 MED FILL — MODERNA COVID-19 VACCINE 10: 100 | 21 days supply | Qty: 0 | Fill #0

## 2020-09-14 NOTE — Progress Notes (Signed)
   Covid-19 Vaccination Clinic  Name:  YENNY KOSA    MRN: [id number] DOB: 1980-11-19  09/14/2020  Ms. Goucher was observed post Covid-19 immunization for 15 minutes without incident. She was provided with Vaccine Information Sheet and instruction to access the V-Safe system.   Ms. Nix was instructed to call 911 with any severe reactions post vaccine: Marland Kitchen Difficulty breathing  . Swelling of face and throat  . A fast heartbeat  . A bad rash all over body  . Dizziness and weakness   Immunizations Administered    Name Date Dose VIS Date Route   Moderna Covid-19 Booster Vaccine 09/14/2020 11:01 AM 0.25 mL 07/08/2020 Intramuscular   Manufacturer: Gala Murdoch   Lot: [id number]   NDC: 22297-989-21

## 2020-10-13 ENCOUNTER — Encounter: Payer: Self-pay | Admitting: Family Medicine

## 2020-10-13 DIAGNOSIS — Z1283 Encounter for screening for malignant neoplasm of skin: Secondary | ICD-10-CM

## 2020-10-13 NOTE — Addendum Note (Signed)
Addended by: Lamar Blinks C on: 10/13/2020 04:09 PM   Modules accepted: Orders

## 2020-10-23 MED FILL — MODAFINIL 200 MG TABLET: 200 | 90 days supply | Qty: 90 | Fill #1

## 2020-10-30 MED FILL — MODAFINIL 200 MG TABLET: 200 | 90 days supply | Qty: 90 | Fill #1

## 2020-11-02 MED FILL — ROSUVASTATIN CALCIUM 5 MG T: 5 | 90 days supply | Qty: 90 | Fill #1

## 2020-11-16 ENCOUNTER — Other Ambulatory Visit (HOSPITAL_COMMUNITY): Payer: Self-pay | Admitting: Pharmacist

## 2020-11-16 MED FILL — CARESTART COVID-19 HOME TES: 4 days supply | Qty: 4 | Fill #0

## 2020-12-11 ENCOUNTER — Other Ambulatory Visit: Payer: Self-pay

## 2020-12-11 NOTE — Progress Notes (Addendum)
[place] at [facility] [address] [place], [place] [address] [phone]  Date:  12/14/2020   Name:  Melissa Owens   DOB:  1981/01/15   MRN:  [id number]  PCP:  Darreld Mclean, MD    Chief Complaint: Annual Exam   History of Present Illness:  Melissa Owens is a 40 y.o. very pleasant female patient who presents with the following:  Generally healthy young woman with history of vertebral artery dissection noted in 2019 and healed on repeat CT angio 2020, stress-induced asthma and hypersomnolence.   She is using modafanil and it works well for her  Here today for physical exam  Last seen by myself in October with calf pain- this has healed up  She has not needed her inhaler in over a year  She is married to Melissa Owens, a clinical pharmacist with [employer]  GYN care per Dr. Valentino Saxon- pap done in November  Mammogram; she plans to do with her GYN  COVID booster- done  Flu up-to-date  Can update full labs today She did have elevation of LFTs last year, minor-basically returned to normal by October  She is doing her normal exercise routine without any difficulty Her children are ages 45, 69 and 68 Patient Active Problem List   Diagnosis Date Noted  . Idiopathic hypersomnolence 07/07/2020  . Family history of narcolepsy 02/18/2019  . Excessive daytime sleepiness 02/18/2019  . Dissection of vertebral artery ([place]) 02/18/2019  . Seasonal affective disorder ([place]) 11/21/2017  . 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 Exercise-induced asthma 11/21/2017    Past Medical History:  Diagnosis Date  . Chicken pox   . Exercise-induced asthma 11/21/2017  . High cholesterol   . Migraines   . Urinary tract infection   . Vertebral artery dissection ([place]) 08/2018   resolved    Past Surgical History:  Procedure Laterality Date  . CESAREAN SECTION  2009  . IR ANGIO INTRA EXTRACRAN SEL COM CAROTID INNOMINATE BILAT MOD SED  08/30/2018  . IR  ANGIO VERTEBRAL SEL VERTEBRAL BILAT MOD SED  08/30/2018  . WISDOM TOOTH EXTRACTION      Social History   Tobacco Use  . Smoking status: Never Smoker  . Smokeless tobacco: Never Used  Substance Use Topics  . Alcohol use: Yes    Alcohol/week: 1.0 - 2.0 standard drink    Types: 1 - 2 Glasses of wine per week  . Drug use: Never    Family History  Problem Relation Age of Onset  . Hypertension Mother   . High Cholesterol Mother   . Cancer Father   . Heart attack Father   . Heart disease Father   . Arthritis Maternal Grandmother   . Cancer Maternal Grandmother   . High Cholesterol Maternal Grandmother   . High blood pressure Maternal Grandmother   . Stroke Maternal Grandmother   . Arthritis Maternal Grandfather   . Diabetes Maternal Grandfather   . Hearing loss Maternal Grandfather   . High Cholesterol Maternal Grandfather   . High blood pressure Maternal Grandfather   . Asthma Paternal Grandmother   . Diabetes Paternal Grandmother   . Miscarriages / Stillbirths Paternal Grandmother   . COPD Paternal Grandfather   . Alcohol abuse Brother   . Drug abuse Brother     Allergies  Allergen Reactions  . Sulfa Antibiotics Hives    Medication list has been reviewed and updated.  Current Outpatient Medications on File  Prior to Visit  Medication Sig Dispense Refill  . albuterol (VENTOLIN HFA) 108 (90 Base) MCG/ACT inhaler Inhale 1-2 puffs into the lungs every 4 (four) hours as needed for wheezing or shortness of breath. 18 Inhaler 1  . aspirin EC 81 MG tablet Take 81 mg by mouth daily.    . modafinil (PROVIGIL) 200 MG tablet Take 1 tablet (200 mg total) by mouth daily. 90 tablet 1  . Multiple Vitamins-Minerals (WOMENS MULTIVITAMIN PO) Take 1 tablet by mouth daily.    . rosuvastatin (CRESTOR) 5 MG tablet Take 1 tablet (5 mg total) by mouth daily. 90 tablet 3   Current Facility-Administered Medications on File Prior to Visit  Medication Dose Route Frequency Provider Last Rate  Last Admin  . ibuprofen (ADVIL,MOTRIN) tablet 600 mg  600 mg Oral Once Randell Teare, Gay Filler, MD        Review of Systems:  As per HPI- otherwise negative.   Physical Examination: Vitals:   12/14/20 0933  BP: 122/82  Pulse: 88  Resp: 16  Temp: 97.8 F (36.6 C)  SpO2: 99%   Vitals:   12/14/20 0933  Weight: 134 lb (60.8 kg)  Height: 5' 3.5" (1.613 m)   Body mass index is 23.36 kg/m. Ideal Body Weight: Weight in (lb) to have BMI = 25: 143.1  GEN: no acute distress. Normal weight, looks well   HEENT: Atraumatic, Normocephalic.   Bilateral TM wnl, oropharynx normal.  PEERL,EOMI.   Ears and Nose: No external deformity. CV: RRR, No M/G/R. No JVD. No thrill. No extra heart sounds. PULM: CTA B, no wheezes, crackles, rhonchi. No retractions. No resp. distress. No accessory muscle use. ABD: S, NT, ND, +BS. No rebound. No HSM. EXTR: No c/c/e PSYCH: Normally interactive. Conversant.    Assessment and Plan: Physical exam  Screening for hyperlipidemia - Plan: Lipid panel  Screening for thyroid disorder - Plan: TSH  Screening for deficiency anemia - Plan: CBC  Screening for diabetes mellitus - Plan: Comprehensive metabolic panel, Hemoglobin A1c  Fatigue, unspecified type - Plan: TSH, VITAMIN D 25 Hydroxy (Vit-D Deficiency, Fractures)  Generally healthy young woman here today for a physical She is fully recovered from vertebral artery dissection a couple years ago Encouraged continued healthy diet and exercise routine Will plan further follow- up pending labs.  She is now on crestor, tolerating without SE Reminded to get mammo this year  This visit occurred during the SARS-CoV-2 public health emergency.  Safety protocols were in place, including screening questions prior to the visit, additional usage of staff PPE, and extensive cleaning of exam room while observing appropriate contact time as indicated for disinfecting solutions.    Signed Lamar Blinks, MD   Received  her labs as below, message to pt  Results for orders placed or performed in visit on 12/14/20  CBC  Result Value Ref Range   WBC 7.7 4.0 - 10.5 K/uL   RBC 4.32 3.87 - 5.11 Mil/uL   Platelets 328.0 150.0 - 400.0 K/uL   Hemoglobin 12.3 12.0 - 15.0 g/dL   HCT 36.8 36.0 - 46.0 %   MCV 85.4 78.0 - 100.0 fl   MCHC 33.4 30.0 - 36.0 g/dL   RDW 14.7 11.5 - 15.5 %  Comprehensive metabolic panel  Result Value Ref Range   Sodium 137 135 - 145 mEq/L   Potassium 4.5 3.5 - 5.1 mEq/L   Chloride 103 96 - 112 mEq/L   CO2 27 19 - 32 mEq/L   Glucose, Bld 104 (  H) 70 - 99 mg/dL   BUN 11 6 - 23 mg/dL   Creatinine, Ser 0.80 0.40 - 1.20 mg/dL   Total Bilirubin 0.4 0.2 - 1.2 mg/dL   Alkaline Phosphatase 72 39 - 117 U/L   AST 22 0 - 37 U/L   ALT 23 0 - 35 U/L   Total Protein 6.8 6.0 - 8.3 g/dL   Albumin 4.4 3.5 - 5.2 g/dL   GFR 92.38 >60.00 mL/min   Calcium 9.7 8.4 - 10.5 mg/dL  Hemoglobin A1c  Result Value Ref Range   Hgb A1c MFr Bld 5.2 4.6 - 6.5 %  Lipid panel  Result Value Ref Range   Cholesterol 171 0 - 200 mg/dL   Triglycerides 107.0 0.0 - 149.0 mg/dL   HDL 58.60 >39.00 mg/dL   VLDL 21.4 0.0 - 40.0 mg/dL   LDL Cholesterol 91 0 - 99 mg/dL   Total CHOL/HDL Ratio 3    NonHDL 112.64   TSH  Result Value Ref Range   TSH 1.80 0.35 - 4.50 uIU/mL  VITAMIN D 25 Hydroxy (Vit-D Deficiency, Fractures)  Result Value Ref Range   VITD 25.99 (L) 30.00 - 100.00 ng/mL

## 2020-12-11 NOTE — Patient Instructions (Signed)
Great to see you again today, as always.  I will be in touch with your labs as soon as possible mammo this year [place] age 40  Take care and keep up the good work!     Health Maintenance, Female Adopting a healthy lifestyle and getting preventive care are important in promoting health and wellness. Ask your health care provider about:  The right schedule for you to have regular tests and exams.  Things you can do on your own to prevent diseases and keep yourself healthy. What should I know about diet, weight, and exercise? Eat a healthy diet  Eat a diet that includes plenty of vegetables, fruits, low-fat dairy products, and lean protein.  Do not eat a lot of foods that are high in solid fats, added sugars, or sodium.   Maintain a healthy weight Body mass index (BMI) is used to identify weight problems. It estimates body fat based on height and weight. Your health care provider can help determine your BMI and help you achieve or maintain a healthy weight. Get regular exercise Get regular exercise. This is one of the most important things you can do for your health. Most adults should:  Exercise for at least 150 minutes each week. The exercise should increase your heart rate and make you sweat (moderate-intensity exercise).  Do strengthening exercises at least twice a week. This is in addition to the moderate-intensity exercise.  Spend less time sitting. Even light physical activity can be beneficial. Watch cholesterol and blood lipids Have your blood tested for lipids and cholesterol at 40 years of age, then have this test every 5 years. Have your cholesterol levels checked more often if:  Your lipid or cholesterol levels are high.  You are older than 40 years of age.  You are at high risk for heart disease. What should I know about cancer screening? Depending on your health history and family history, you may need to have cancer screening at various ages. This may include  screening for:  Breast cancer.  Cervical cancer.  Colorectal cancer.  Skin cancer.  Lung cancer. What should I know about heart disease, diabetes, and high blood pressure? Blood pressure and heart disease  High blood pressure causes heart disease and increases the risk of stroke. This is more likely to develop in people who have high blood pressure readings, are of African descent, or are overweight.  Have your blood pressure checked: ? Every 3-5 years if you are 66-65 years of age. ? Every year if you are 70 years old or older. Diabetes Have regular diabetes screenings. This checks your fasting blood sugar level. Have the screening done:  Once every three years after age 15 if you are at a normal weight and have a low risk for diabetes.  More often and at a younger age if you are overweight or have a high risk for diabetes. What should I know about preventing infection? Hepatitis B If you have a higher risk for hepatitis B, you should be screened for this virus. Talk with your health care provider to find out if you are at risk for hepatitis B infection. Hepatitis C Testing is recommended for:  Everyone born from [birth year] through 1965.  Anyone with known risk factors for hepatitis C. Sexually transmitted infections (STIs)  Get screened for STIs, including gonorrhea and chlamydia, if: ? You are sexually active and are younger than 40 years of age. ? You are older than 40 years of age and your health  care provider tells you that you are at risk for this type of infection. ? Your sexual activity has changed since you were last screened, and you are at increased risk for chlamydia or gonorrhea. Ask your health care provider if you are at risk.  Ask your health care provider about whether you are at high risk for HIV. Your health care provider may recommend a prescription medicine to help prevent HIV infection. If you choose to take medicine to prevent HIV, you should first get  tested for HIV. You should then be tested every 3 months for as long as you are taking the medicine. Pregnancy  If you are about to stop having your period (premenopausal) and you may become pregnant, seek counseling before you get pregnant.  Take 400 to 800 micrograms (mcg) of folic acid every day if you become pregnant.  Ask for birth control (contraception) if you want to prevent pregnancy. Osteoporosis and menopause Osteoporosis is a disease in which the bones lose minerals and strength with aging. This can result in bone fractures. If you are 59 years old or older, or if you are at risk for osteoporosis and fractures, ask your health care provider if you should:  Be screened for bone loss.  Take a calcium or vitamin D supplement to lower your risk of fractures.  Be given hormone replacement therapy (HRT) to treat symptoms of menopause. Follow these instructions at home: Lifestyle  Do not use any products that contain nicotine or tobacco, such as cigarettes, e-cigarettes, and chewing tobacco. If you need help quitting, ask your health care provider.  Do not use street drugs.  Do not share needles.  Ask your health care provider for help if you need support or information about quitting drugs. Alcohol use  Do not drink alcohol if: ? Your health care provider tells you not to drink. ? You are pregnant, may be pregnant, or are planning to become pregnant.  If you drink alcohol: ? Limit how much you use to 0-1 drink a day. ? Limit intake if you are breastfeeding.  Be aware of how much alcohol is in your drink. In the [place] one drink equals one 12 oz bottle of beer (355 mL), one 5 oz glass of wine (148 mL), or one 1 oz glass of hard liquor (44 mL). General instructions  Schedule regular health, dental, and eye exams.  Stay current with your vaccines.  Tell your health care provider if: ? You often feel depressed. ? You have ever been abused or do not feel safe at  home. Summary  Adopting a healthy lifestyle and getting preventive care are important in promoting health and wellness.  Follow your health care provider's instructions about healthy diet, exercising, and getting tested or screened for diseases.  Follow your health care provider's instructions on monitoring your cholesterol and blood pressure. This information is not intended to replace advice given to you by your health care provider. Make sure you discuss any questions you have with your health care provider. Document Revised: 08/29/2018 Document Reviewed: 08/29/2018 Elsevier Patient Education  2021 [employer].

## 2020-12-14 ENCOUNTER — Encounter: Payer: Self-pay | Admitting: Family Medicine

## 2020-12-14 ENCOUNTER — Other Ambulatory Visit: Payer: Self-pay

## 2020-12-14 ENCOUNTER — Ambulatory Visit (INDEPENDENT_AMBULATORY_CARE_PROVIDER_SITE_OTHER): Payer: No Typology Code available for payment source | Admitting: Family Medicine

## 2020-12-14 VITALS — BP 122/82 | HR 88 | Temp 97.8°F | Resp 16 | Ht 63.5 in | Wt 134.0 lb

## 2020-12-14 DIAGNOSIS — Z0000 Encounter for general adult medical examination without abnormal findings: Secondary | ICD-10-CM | POA: Diagnosis not present

## 2020-12-14 DIAGNOSIS — Z1329 Encounter for screening for other suspected endocrine disorder: Secondary | ICD-10-CM

## 2020-12-14 DIAGNOSIS — Z114 Encounter for screening for human immunodeficiency virus [HIV]: Secondary | ICD-10-CM

## 2020-12-14 DIAGNOSIS — R5383 Other fatigue: Secondary | ICD-10-CM

## 2020-12-14 DIAGNOSIS — Z131 Encounter for screening for diabetes mellitus: Secondary | ICD-10-CM

## 2020-12-14 DIAGNOSIS — Z130 Encounter for screening for diseases of the blood and blood-forming organs and certain disorders involving the immune mechanism: Secondary | ICD-10-CM | POA: Diagnosis not present

## 2020-12-14 DIAGNOSIS — Z13220 Encounter for screening for lipoid disorders: Secondary | ICD-10-CM | POA: Diagnosis not present

## 2020-12-14 LAB — CBC
HCT: 36.8 % (ref 36.0–46.0)
Hemoglobin: 12.3 g/dL (ref 12.0–15.0)
MCHC: 33.4 g/dL (ref 30.0–36.0)
MCV: 85.4 fl (ref 78.0–100.0)
Platelets: 328 10*3/uL (ref 150.0–400.0)
RBC: 4.32 Mil/uL (ref 3.87–5.11)
RDW: 14.7 % (ref 11.5–15.5)
WBC: 7.7 10*3/uL (ref 4.0–10.5)

## 2020-12-14 LAB — COMPREHENSIVE METABOLIC PANEL
ALT: 23 U/L (ref 0–35)
AST: 22 U/L (ref 0–37)
Albumin: 4.4 g/dL (ref 3.5–5.2)
Alkaline Phosphatase: 72 U/L (ref 39–117)
BUN: 11 mg/dL (ref 6–23)
CO2: 27 mEq/L (ref 19–32)
Calcium: 9.7 mg/dL (ref 8.4–10.5)
Chloride: 103 mEq/L (ref 96–112)
Creatinine, Ser: 0.8 mg/dL (ref 0.40–1.20)
GFR: 92.38 mL/min (ref >60.00)
Glucose, Bld: 104 mg/dL — ABNORMAL HIGH (ref 70–99)
Potassium: 4.5 mEq/L (ref 3.5–5.1)
Sodium: 137 mEq/L (ref 135–145)
Total Bilirubin: 0.4 mg/dL (ref 0.2–1.2)
Total Protein: 6.8 g/dL (ref 6.0–8.3)

## 2020-12-14 LAB — LIPID PANEL
Cholesterol: 171 mg/dL (ref 0–200)
HDL: 58.6 mg/dL (ref >39.00)
LDL Cholesterol: 91 mg/dL (ref 0–99)
NonHDL: 112.64
Total CHOL/HDL Ratio: 3
Triglycerides: 107 mg/dL (ref 0.0–149.0)
VLDL: 21.4 mg/dL (ref 0.0–40.0)

## 2020-12-14 LAB — HEMOGLOBIN A1C: Hgb A1c MFr Bld: 5.2 % (ref 4.6–6.5)

## 2020-12-14 LAB — VITAMIN D 25 HYDROXY (VIT D DEFICIENCY, FRACTURES): VITD: 25.99 ng/mL — ABNORMAL LOW (ref 30.00–100.00)

## 2020-12-14 LAB — TSH: TSH: 1.8 u[IU]/mL (ref 0.35–4.50)

## 2020-12-24 ENCOUNTER — Encounter: Payer: Self-pay | Admitting: Family Medicine

## 2020-12-31 ENCOUNTER — Encounter: Payer: Self-pay | Admitting: Family Medicine

## 2020-12-31 ENCOUNTER — Other Ambulatory Visit (HOSPITAL_BASED_OUTPATIENT_CLINIC_OR_DEPARTMENT_OTHER): Payer: Self-pay

## 2020-12-31 MED ORDER — OSELTAMIVIR PHOSPHATE 75 MG PO CAPS
75.0000 mg | ORAL_CAPSULE | Freq: Two times a day (BID) | ORAL | 0 refills | Status: DC
  Filled 2020-12-31: qty 10, 5d supply, fill #0

## 2021-01-08 ENCOUNTER — Other Ambulatory Visit (HOSPITAL_BASED_OUTPATIENT_CLINIC_OR_DEPARTMENT_OTHER): Payer: Self-pay

## 2021-01-24 IMAGING — CR DG FOOT COMPLETE 3+V*R*
3 series · 3 of 3 positions shown · non-contrast
Comparison: None.

CLINICAL DATA: Right foot pain after injury.

EXAM:
RIGHT FOOT COMPLETE - 3+ VIEW

[x foot ap right]
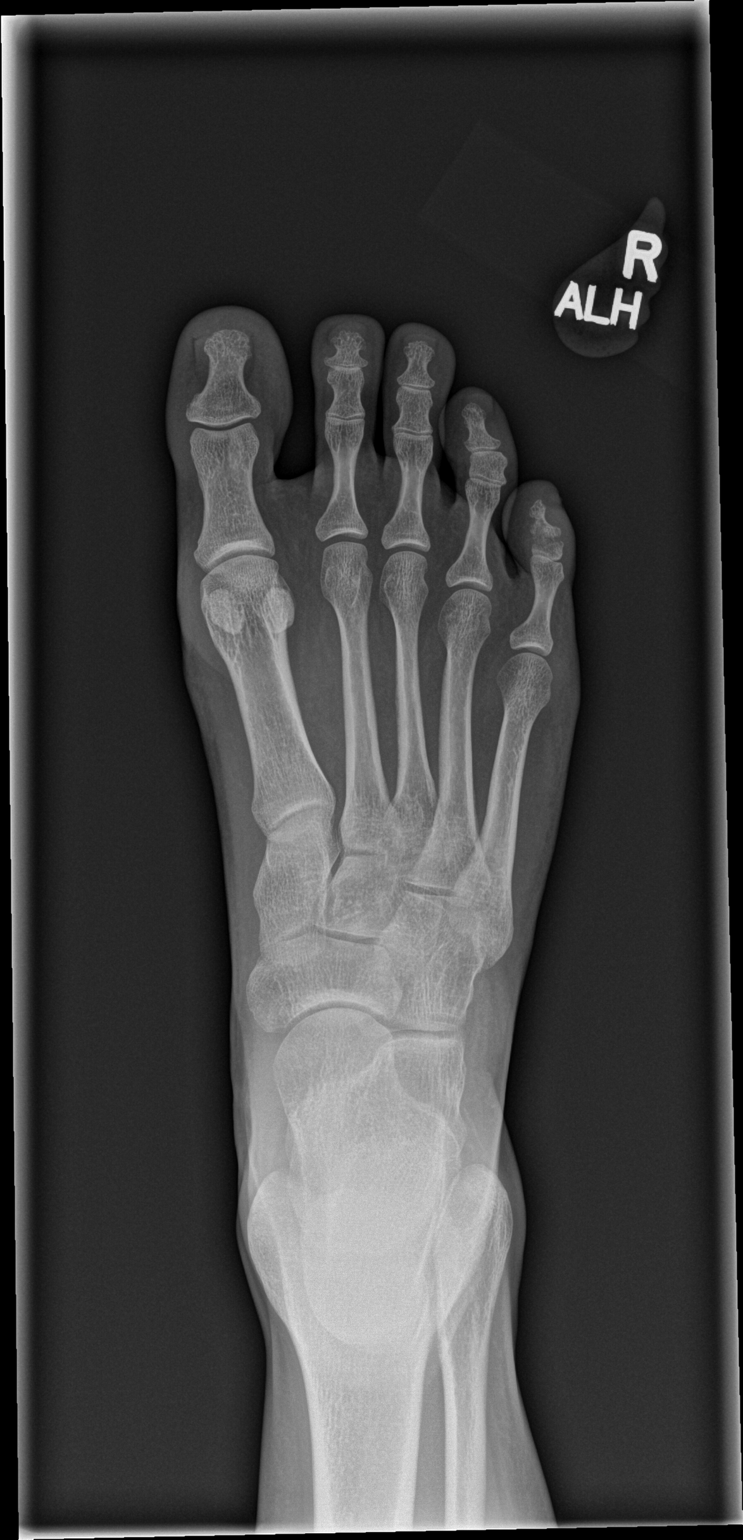

[x foot obl right]
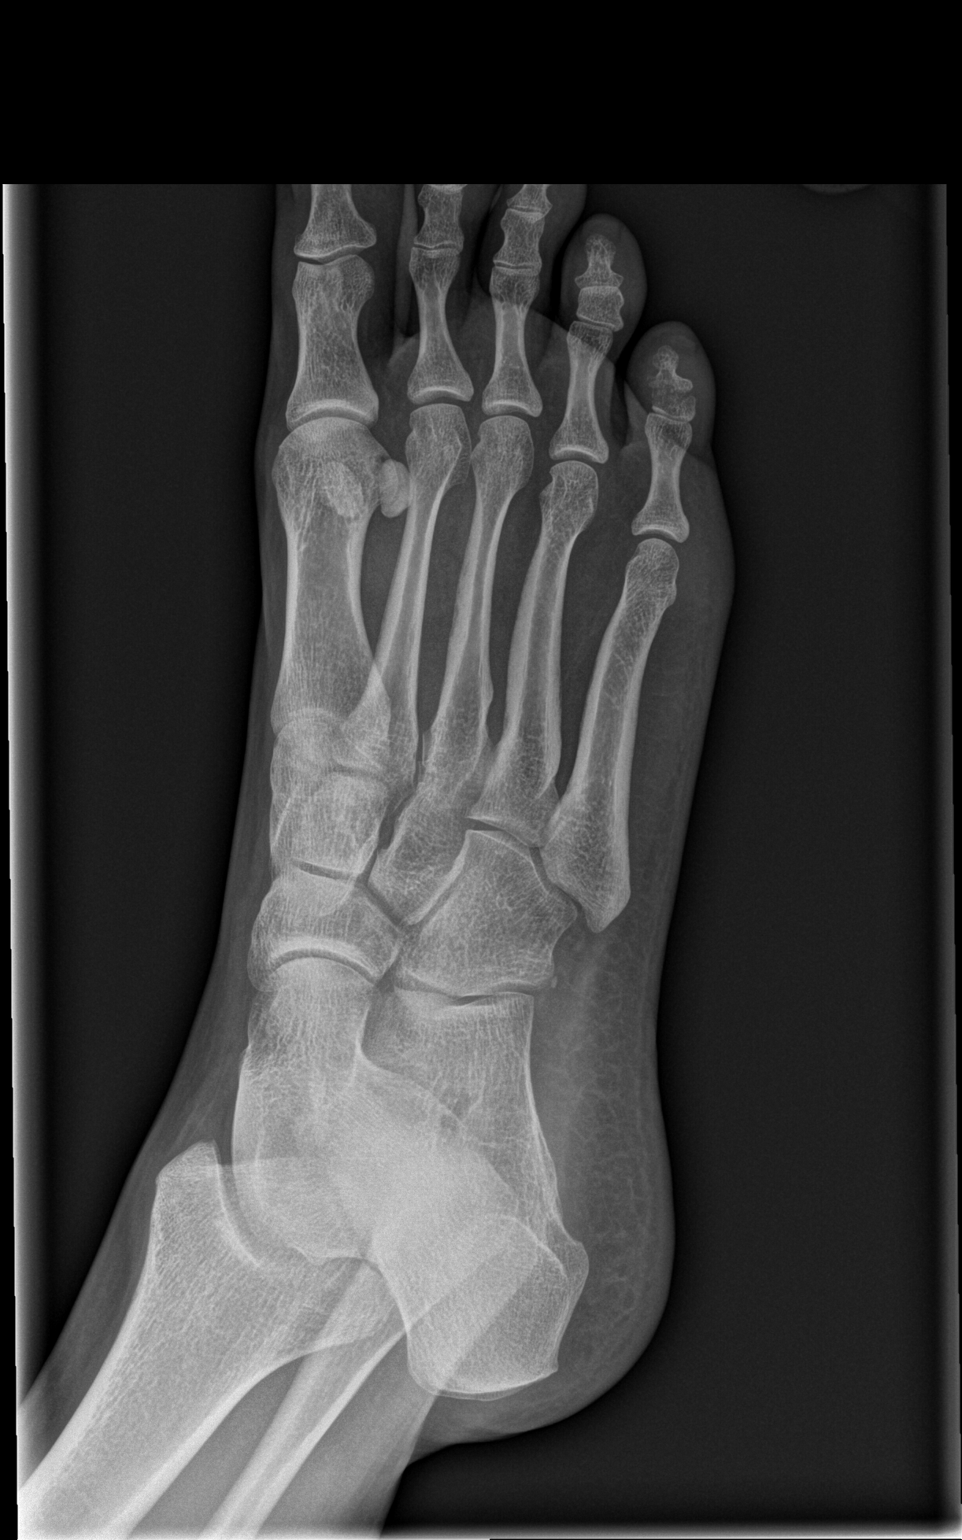

[x foot lat right]
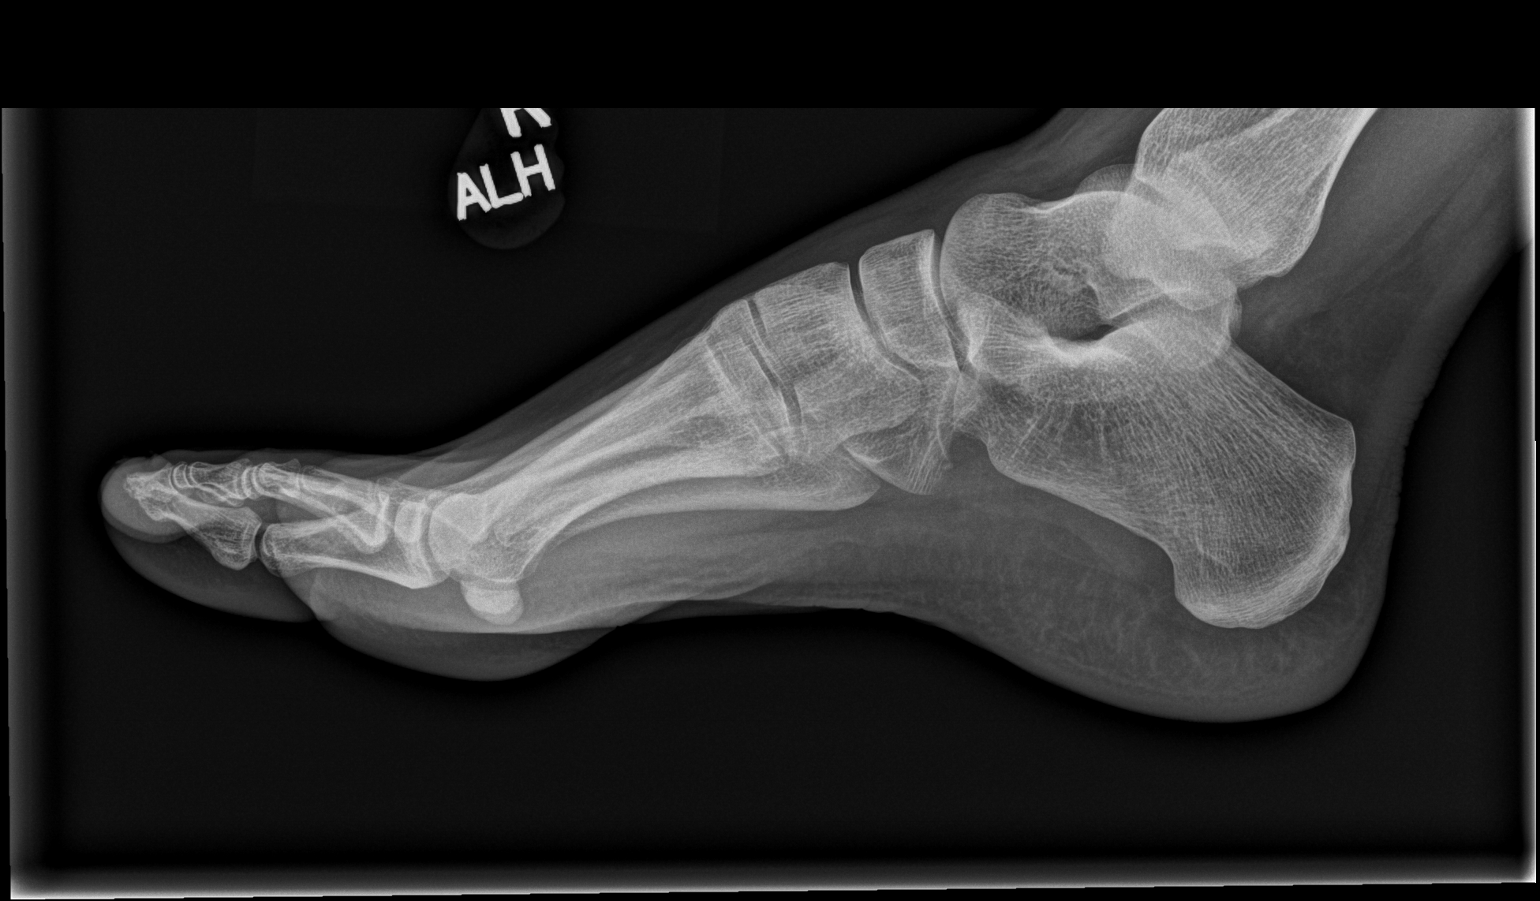

[3 of 3 positions shown; findings below may reference images not displayed]

FINDINGS: There appears to be probable mildly displaced fracture involving the
fifth middle phalanx. No other bony abnormality is noted. No soft
tissue abnormality is noted.
IMPRESSION: Probable mildly displaced fifth middle phalangeal fracture.

## 2021-02-01 ENCOUNTER — Other Ambulatory Visit (HOSPITAL_BASED_OUTPATIENT_CLINIC_OR_DEPARTMENT_OTHER): Payer: Self-pay

## 2021-02-01 MED FILL — Rosuvastatin Calcium Tab 5 MG: ORAL | 90 days supply | Qty: 90 | Fill #0 | Status: AC

## 2021-03-23 ENCOUNTER — Other Ambulatory Visit: Payer: Self-pay | Admitting: Family Medicine

## 2021-03-23 ENCOUNTER — Other Ambulatory Visit (HOSPITAL_BASED_OUTPATIENT_CLINIC_OR_DEPARTMENT_OTHER): Payer: Self-pay

## 2021-03-24 ENCOUNTER — Other Ambulatory Visit (HOSPITAL_BASED_OUTPATIENT_CLINIC_OR_DEPARTMENT_OTHER): Payer: Self-pay

## 2021-03-24 MED ORDER — MODAFINIL 200 MG PO TABS
ORAL_TABLET | Freq: Every day | ORAL | 1 refills | Status: DC
  Filled 2021-03-24: qty 90, 90d supply, fill #0
  Filled 2021-07-05: qty 90, 90d supply, fill #1

## 2021-05-10 ENCOUNTER — Other Ambulatory Visit (HOSPITAL_BASED_OUTPATIENT_CLINIC_OR_DEPARTMENT_OTHER): Payer: Self-pay

## 2021-05-10 MED ORDER — CARESTART COVID-19 HOME TEST VI KIT
PACK | 0 refills | Status: DC
  Filled 2021-05-10: qty 4, 4d supply, fill #0

## 2021-05-11 ENCOUNTER — Other Ambulatory Visit (HOSPITAL_BASED_OUTPATIENT_CLINIC_OR_DEPARTMENT_OTHER): Payer: Self-pay

## 2021-05-11 MED FILL — Rosuvastatin Calcium Tab 5 MG: ORAL | 90 days supply | Qty: 90 | Fill #1 | Status: AC

## 2021-05-17 ENCOUNTER — Other Ambulatory Visit (HOSPITAL_BASED_OUTPATIENT_CLINIC_OR_DEPARTMENT_OTHER): Payer: Self-pay

## 2021-06-21 ENCOUNTER — Other Ambulatory Visit (HOSPITAL_BASED_OUTPATIENT_CLINIC_OR_DEPARTMENT_OTHER): Payer: Self-pay

## 2021-07-05 ENCOUNTER — Other Ambulatory Visit (HOSPITAL_BASED_OUTPATIENT_CLINIC_OR_DEPARTMENT_OTHER): Payer: Self-pay

## 2021-07-08 ENCOUNTER — Ambulatory Visit: Payer: No Typology Code available for payment source | Admitting: Family Medicine

## 2021-07-09 ENCOUNTER — Other Ambulatory Visit (HOSPITAL_BASED_OUTPATIENT_CLINIC_OR_DEPARTMENT_OTHER): Payer: Self-pay

## 2021-07-09 MED ORDER — INFLUENZA VAC SPLIT QUAD 0.5 ML IM SUSY
PREFILLED_SYRINGE | INTRAMUSCULAR | 0 refills | Status: DC
  Filled 2021-07-09: qty 0.5, 1d supply, fill #0

## 2021-07-09 NOTE — Progress Notes (Signed)
[place] at [facility] [address], [place], [place] [address] [phone]  Date:  07/12/2021   Name:  Melissa Owens   DOB:  07/03/1981   MRN:  [id number]  PCP:  Darreld Mclean, MD    Chief Complaint: Depression (Pt c/o possible symptoms of depression. She has been seeing a counselor for three sessions now in order to help manage stress better, and she is of the opinion that she might  have some depression and might need medication./Flu shot today: received friday)   History of Present Illness:  Melissa Owens is a 40 y.o. very pleasant female patient who presents with the following:  Generally healthy young woman with history of vertebral artery dissection noted in 2019 and healed on repeat CT angio 2020, stress-induced asthma and hypersomnolence.   She is on low-dose Crestor for lipid management She is using modafanil and it works well for her   Melissa Owens is seen today for concern of possible depression Most recent visit with myself was in March for her annual physical  She has 3 school-aged children; 13,13, and 4 yo   She is getting benefit from counseling- they talked about ADD, but more recently have suspected depression  She did use wellbutrin in the past - she stopped taking it about 2 years ago prior to a sleep study and never happened to restart She feels lower motivation than she typically would; notes that the end of the day she just feels like sitting on the couch which is not typical for her She may feel overwhelmed by tasks at home Some tearfulness Appetite is ok  Sleep is ok She does not feel like anxiety is playing a role No SI  Never had a seizure   Discussed options with Melissa Owens, she is interested in going back on Wellbutrin 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   Family history of narcolepsy 02/18/2019   Excessive daytime sleepiness 02/18/2019    Dissection of vertebral artery ([place]) 02/18/2019   Seasonal affective disorder ([place]) 11/21/2017   Familial hyperlipidemia 11/21/2017   Exercise-induced asthma 11/21/2017    Past Medical History:  Diagnosis Date   Chicken pox    Exercise-induced asthma 11/21/2017   High cholesterol    Migraines    Urinary tract infection    Vertebral artery dissection ([place]) 08/2018   resolved    Past Surgical History:  Procedure Laterality Date   CESAREAN SECTION  2009   IR ANGIO INTRA EXTRACRAN SEL COM CAROTID INNOMINATE BILAT MOD SED  08/30/2018   IR ANGIO VERTEBRAL SEL VERTEBRAL BILAT MOD SED  08/30/2018   WISDOM TOOTH EXTRACTION      Social History   Tobacco Use   Smoking status: Never   Smokeless tobacco: Never  Substance Use Topics   Alcohol use: Yes    Alcohol/week: 1.0 - 2.0 standard drink    Types: 1 - 2 Glasses of wine per week   Drug use: Never    Family History  Problem Relation Age of Onset   Hypertension Mother    High Cholesterol Mother    Cancer Father    Heart attack Father    Heart disease Father    Arthritis Maternal Grandmother    Cancer Maternal Grandmother    High Cholesterol Maternal Grandmother    High blood pressure Maternal Grandmother    Stroke Maternal Grandmother    Arthritis Maternal  Grandfather    Diabetes Maternal Grandfather    Hearing loss Maternal Grandfather    High Cholesterol Maternal Grandfather    High blood pressure Maternal Grandfather    Asthma Paternal Grandmother    Diabetes Paternal Grandmother    Miscarriages / Stillbirths Paternal Grandmother    COPD Paternal Grandfather    Alcohol abuse Brother    Drug abuse Brother     Allergies  Allergen Reactions   Sulfa Antibiotics Hives    Medication list has been reviewed and updated.  Current Outpatient Medications on File Prior to Visit  Medication Sig Dispense Refill   albuterol (VENTOLIN HFA) 108 (90 Base) MCG/ACT inhaler Inhale 1-2 puffs into the lungs every 4 (four) hours  as needed for wheezing or shortness of breath. 18 Inhaler 1   aspirin EC 81 MG tablet Take 81 mg by mouth daily.     COVID-19 At Home Antigen Test [facility] COVID-19 HOME TEST) KIT Use as directed per package instructions 4 each 0   COVID-19 At Home Antigen Test KIT USE AS DIRECTED 4 kit 0   COVID-19 mRNA vaccine, Moderna, 100 MCG/0.5ML injection INJECT AS DIRECTED .25 mL 0   influenza vac split quadrivalent PF (FLUARIX) 0.5 ML injection Inject into the muscle. 0.5 mL 0   modafinil (PROVIGIL) 200 MG tablet TAKE 1 TABLET (200 MG TOTAL) BY MOUTH DAILY. 90 tablet 1   Multiple Vitamins-Minerals (WOMENS MULTIVITAMIN PO) Take 1 tablet by mouth daily.     rosuvastatin (CRESTOR) 5 MG tablet TAKE 1 TABLET BY MOUTH ONCE DAILY 90 tablet 3   Current Facility-Administered Medications on File Prior to Visit  Medication Dose Route Frequency Provider Last Rate Last Admin   ibuprofen (ADVIL,MOTRIN) tablet 600 mg  600 mg Oral Once Eliga Arvie, Gay Filler, MD        Review of Systems:  As per HPI- otherwise negative.   Physical Examination: Vitals:   07/12/21 1043  BP: 118/64  Pulse: 92  Resp: 18  Temp: 98 F (36.7 C)  SpO2: 100%   Vitals:   07/12/21 1043  Weight: 139 lb 6.4 oz (63.2 kg)  Height: '5\' 4"'  (1.626 m)   Body mass index is 23.93 kg/m. Ideal Body Weight: Weight in (lb) to have BMI = 25: 145.3  GEN: no acute distress.  Normal weight, looks well HEENT: Atraumatic, Normocephalic.  Ears and Nose: No external deformity. CV: RRR, No M/G/R. No JVD. No thrill. No extra heart sounds. PULM: CTA B, no wheezes, crackles, rhonchi. No retractions. No resp. distress. No accessory muscle use EXTR: No c/c/e PSYCH: Normally interactive. Conversant.    Assessment and Plan: Persistent depressive disorder - Plan: buPROPion (WELLBUTRIN XL) 150 MG 24 hr tablet  Patient with history of previous Wellbutrin use, seen today with recurrence of depression symptoms.  She is seeing a counselor suggested that  she seek treatment.  We will start her on bupropion 150, can increase to 300 mg after about a week.  If she is doing well with 300 mg she will let me know and I can update her prescription  If any side effects or is not seeing improvement she will also contact me  Signed Lamar Blinks, MD

## 2021-07-12 ENCOUNTER — Other Ambulatory Visit: Payer: Self-pay

## 2021-07-12 ENCOUNTER — Ambulatory Visit (INDEPENDENT_AMBULATORY_CARE_PROVIDER_SITE_OTHER): Payer: No Typology Code available for payment source | Admitting: Family Medicine

## 2021-07-12 ENCOUNTER — Other Ambulatory Visit (HOSPITAL_BASED_OUTPATIENT_CLINIC_OR_DEPARTMENT_OTHER): Payer: Self-pay

## 2021-07-12 VITALS — BP 118/64 | HR 92 | Temp 98.0°F | Resp 18 | Ht 64.0 in | Wt 139.4 lb

## 2021-07-12 DIAGNOSIS — F341 Dysthymic disorder: Secondary | ICD-10-CM

## 2021-07-12 MED ORDER — BUPROPION HCL ER (XL) 150 MG PO TB24
150.0000 mg | ORAL_TABLET | Freq: Every day | ORAL | 3 refills | Status: DC
  Filled 2021-07-12: qty 60, 30d supply, fill #0
  Filled 2021-08-16: qty 60, 30d supply, fill #1

## 2021-07-12 NOTE — Patient Instructions (Addendum)
Good to see you today- we will try going back on wellbutrin and see how you do!   Start on wellbutrin xl 150, can increase to 2 pills/ 300 mg after a week or so If you like the 300 message me and I can change your rx to the 300 mg strength   If you do not improve please contact me!

## 2021-07-13 ENCOUNTER — Other Ambulatory Visit (HOSPITAL_BASED_OUTPATIENT_CLINIC_OR_DEPARTMENT_OTHER): Payer: Self-pay

## 2021-07-29 NOTE — Patient Instructions (Signed)
Below is our plan:  We will continue modafinil 200mg  daily   Please make sure you are staying well hydrated. I recommend 50-60 ounces daily. Well balanced diet and regular exercise encouraged. Consistent sleep schedule with 6-8 hours recommended.   Please continue follow up with care team as directed.   Follow up with me in 1 year   You may receive a survey regarding today's visit. I encourage you to leave honest feed back as I do use this information to improve patient care. Thank you for seeing me today!

## 2021-07-29 NOTE — Progress Notes (Signed)
   PATIENT: Melissa Owens DOB: June 17, 1981  REASON FOR VISIT: follow up HISTORY FROM: patient  Virtual Visit via Telephone Note  I connected with Melissa Owens on 08/03/21 at  7:30 AM EST by telephone and verified that I am speaking with the correct person using two identifiers.   I discussed the limitations, risks, security and privacy concerns of performing an evaluation and management service by telephone and the availability of in person appointments. I also discussed with the patient that there may be a patient responsible charge related to this service. The patient expressed understanding and agreed to proceed.   History of Present Illness:  08/03/21 ALL: Melissa Owens is a 40 y.o. female here today for follow up for idiopathic hypersomnolence. She continues modafinil 200mg  daily. She continues to note benefit. No significant changes. She recently restarted Wellbutrin and is tolerating well. She is feeling well and denies concerns, today.   07/07/20 ALL:  Melissa Owens is a 40 y.o. female here today for follow up for idiopathic hypersomnolence.  Narcolepsy gene positive, hoever, MSLT showed "This multiple sleep latency test reveals a mean sleep latency of 15.8 minutes with 3 out of 5 nap opportunities during which no sleep was recorded. 2. There were no sleep periods during which REM sleep was recorded". She has continued modafinil 200mg . She feels that this definitely helps. Occasionally she will take 1/2 tablet if she isn't busy. She denies adverse effects. She is feeling well today. She had labs with PCP this morning.   Observations/Objective:  Generalized: Well developed, in no acute distress  Mentation: Alert oriented to time, place, history taking. Follows all commands speech and language fluent   Assessment and Plan:  40 y.o. year old female  has a past medical history of Chicken pox, Exercise-induced asthma (11/21/2017), High cholesterol, Migraines, Urinary tract infection, and  Vertebral artery dissection ([place]) (08/2018). here with    ICD-10-CM   1. Idiopathic hypersomnolence  G47.11       Melissa Owens continues to do well on modafinil. PDMP shows appropriate refills, last refilled 07/02/2021 for 90 days. She will call when she needs a refill. Healthy lifestyle habits advised. She will follow up in 1 year, sooner if needed.   No orders of the defined types were placed in this encounter.   No orders of the defined types were placed in this encounter.    Follow Up Instructions:  I discussed the assessment and treatment plan with the patient. The patient was provided an opportunity to ask questions and all were answered. The patient agreed with the plan and demonstrated an understanding of the instructions.   The patient was advised to call back or seek an in-person evaluation if the symptoms worsen or if the condition fails to improve as anticipated.  I provided 15 minutes of non-face-to-face time during this encounter. Patient located at their place of residence during [place] visit. Provider is in the office.    Debbora Presto, NP

## 2021-08-03 ENCOUNTER — Telehealth (INDEPENDENT_AMBULATORY_CARE_PROVIDER_SITE_OTHER): Payer: No Typology Code available for payment source | Admitting: Family Medicine

## 2021-08-03 ENCOUNTER — Encounter: Payer: Self-pay | Admitting: Family Medicine

## 2021-08-03 DIAGNOSIS — G4711 Idiopathic hypersomnia with long sleep time: Secondary | ICD-10-CM | POA: Diagnosis not present

## 2021-08-16 ENCOUNTER — Other Ambulatory Visit (HOSPITAL_BASED_OUTPATIENT_CLINIC_OR_DEPARTMENT_OTHER): Payer: Self-pay

## 2021-08-18 ENCOUNTER — Other Ambulatory Visit (HOSPITAL_BASED_OUTPATIENT_CLINIC_OR_DEPARTMENT_OTHER): Payer: Self-pay

## 2021-08-18 ENCOUNTER — Other Ambulatory Visit: Payer: Self-pay | Admitting: Family Medicine

## 2021-08-18 MED ORDER — ROSUVASTATIN CALCIUM 5 MG PO TABS
ORAL_TABLET | Freq: Every day | ORAL | 3 refills | Status: DC
  Filled 2021-08-18: qty 90, 90d supply, fill #0
  Filled 2021-11-22: qty 90, 90d supply, fill #1
  Filled 2022-02-21: qty 90, 90d supply, fill #2
  Filled 2022-05-16: qty 90, 90d supply, fill #3

## 2021-09-20 ENCOUNTER — Other Ambulatory Visit (HOSPITAL_BASED_OUTPATIENT_CLINIC_OR_DEPARTMENT_OTHER): Payer: Self-pay

## 2021-09-20 ENCOUNTER — Other Ambulatory Visit: Payer: Self-pay | Admitting: Family Medicine

## 2021-09-20 DIAGNOSIS — F341 Dysthymic disorder: Secondary | ICD-10-CM

## 2021-09-20 MED ORDER — BUPROPION HCL ER (XL) 300 MG PO TB24
300.0000 mg | ORAL_TABLET | Freq: Every day | ORAL | 3 refills | Status: DC
  Filled 2021-09-20: qty 90, 90d supply, fill #0
  Filled 2021-12-22: qty 90, 90d supply, fill #1

## 2021-10-11 ENCOUNTER — Other Ambulatory Visit (HOSPITAL_BASED_OUTPATIENT_CLINIC_OR_DEPARTMENT_OTHER): Payer: Self-pay

## 2021-10-11 MED ORDER — CARESTART COVID-19 HOME TEST VI KIT
PACK | 0 refills | Status: DC
  Filled 2021-10-11: qty 2, 4d supply, fill #0

## 2021-11-10 ENCOUNTER — Other Ambulatory Visit (HOSPITAL_BASED_OUTPATIENT_CLINIC_OR_DEPARTMENT_OTHER): Payer: Self-pay

## 2021-11-10 ENCOUNTER — Other Ambulatory Visit: Payer: Self-pay | Admitting: Neurology

## 2021-11-10 MED ORDER — MODAFINIL 200 MG PO TABS
ORAL_TABLET | Freq: Every day | ORAL | 0 refills | Status: DC
  Filled 2021-11-10: qty 90, 90d supply, fill #0

## 2021-11-10 NOTE — Telephone Encounter (Signed)
Received refill request for modafinil.  Last OV was on 08/03/21.  Next OV is scheduled for 08/09/22 .  Last RX was written on 07/05/21 for 90 tabs.   [facility] Database has been reviewed.

## 2021-11-22 ENCOUNTER — Other Ambulatory Visit (HOSPITAL_BASED_OUTPATIENT_CLINIC_OR_DEPARTMENT_OTHER): Payer: Self-pay

## 2021-11-24 NOTE — Patient Instructions (Addendum)
It was great to see you again today!  I will be in touch with your labs asap ?Keep up the good work and please let me know if you need anything/ refills ? ?Consider getting a covid booster if not done in the last 6 months or so ? ? ?

## 2021-11-24 NOTE — Progress Notes (Addendum)
Therapist, [facility] at [facility] ?[address], [address] ?[place], [place] [address] ?336 [phone] ?Fax [phone] ? ?Date:  11/29/2021  ? ?Name:  Melissa Owens   DOB:  [birth year]   MRN:  [id number] ? ?PCP:  Darreld Mclean, MD  ? ? ?Chief Complaint: Annual Exam (Concerns/ questions: none/HIV screen due) ? ? ?History of Present Illness: ? ?Melissa Owens is a 41 y.o. very pleasant female patient who presents with the following: ? ?Patient seen today for physical exam ?Most recent visit with myself was in October ? ?Generally healthy young woman with history of vertebral artery dissection noted in 2019 and healed on repeat CT angio 2020, stress-induced asthma and hypersomnolence.   ?She is on low-dose Crestor for lipid management- she is tolerating well  ?She is using modafanil and it works well for her  ? ?She has 3 school-aged children- they are 13, 5 and 11 ?They are all doing ok  ?At her last visit Melissa Owens was feeling a bit more overwhelmed and had no depression symptoms.  We started her back on Wellbutrin- she notes this is helping her  ?No recent asthma sx  ? ?COVID booster- recommended  ?Pap is up-to-date- per her GYN Dr Pamala Hurry  ?Mammogram- done in December  ?Lab work done about 1 year ago, can update today ?She is fasting today  ? ?She enjoys exercising by weight lifting at home  ? ?Patient Active Problem List  ? Diagnosis Date Noted  ? Idiopathic hypersomnolence 07/07/2020  ? Family history of narcolepsy 02/18/2019  ? Excessive daytime sleepiness 02/18/2019  ? Dissection of vertebral artery ([place]) 02/18/2019  ? Seasonal affective disorder ([place]) 11/21/2017  ? Familial hyperlipidemia 11/21/2017  ? Exercise-induced asthma 11/21/2017  ? ? ?Past Medical History:  ?Diagnosis Date  ? Chicken pox   ? Exercise-induced asthma 11/21/2017  ? High cholesterol   ? Migraines   ? Urinary tract infection   ? Vertebral artery dissection ([place]) 08/2018  ? resolved  ? ? ?Past Surgical History:  ?Procedure Laterality  Date  ? CESAREAN SECTION  2009  ? IR ANGIO INTRA EXTRACRAN SEL COM CAROTID INNOMINATE BILAT MOD SED  08/30/2018  ? IR ANGIO VERTEBRAL SEL VERTEBRAL BILAT MOD SED  08/30/2018  ? WISDOM TOOTH EXTRACTION    ? ? ?Social History  ? ?Tobacco Use  ? Smoking status: Never  ? Smokeless tobacco: Never  ?Substance Use Topics  ? Alcohol use: Yes  ?  Alcohol/week: 1.0 - 2.0 standard drink  ?  Types: 1 - 2 Glasses of wine per week  ? Drug use: Never  ? ? ?Family History  ?Problem Relation Age of Onset  ? Hypertension Mother   ? High Cholesterol Mother   ? Cancer Father   ? Heart attack Father   ? Heart disease Father   ? Arthritis Maternal Grandmother   ? Cancer Maternal Grandmother   ? High Cholesterol Maternal Grandmother   ? High blood pressure Maternal Grandmother   ? Stroke Maternal Grandmother   ? Arthritis Maternal Grandfather   ? Diabetes Maternal Grandfather   ? Hearing loss Maternal Grandfather   ? High Cholesterol Maternal Grandfather   ? High blood pressure Maternal Grandfather   ? Asthma Paternal Grandmother   ? Diabetes Paternal Grandmother   ? Miscarriages / Stillbirths Paternal Grandmother   ? COPD Paternal Grandfather   ? Alcohol abuse Brother   ? Drug abuse Brother   ? ? ?Allergies  ?Allergen  Reactions  ? Sulfa Antibiotics Hives  ? ? ?Medication list has been reviewed and updated. ? ?Current Outpatient Medications on File Prior to Visit  ?Medication Sig Dispense Refill  ? albuterol (VENTOLIN HFA) 108 (90 Base) MCG/ACT inhaler Inhale 1-2 puffs into the lungs every 4 (four) hours as needed for wheezing or shortness of breath. 18 Inhaler 1  ? aspirin EC 81 MG tablet Take 81 mg by mouth daily.    ? buPROPion (WELLBUTRIN XL) 300 MG 24 hr tablet Take 1 tablet (300 mg total) by mouth daily. 90 tablet 3  ? influenza vac split quadrivalent PF (FLUARIX) 0.5 ML injection Inject into the muscle. 0.5 mL 0  ? modafinil (PROVIGIL) 200 MG tablet TAKE 1 TABLET (200 MG TOTAL) BY MOUTH DAILY. 90 tablet 0  ? Multiple  Vitamins-Minerals (WOMENS MULTIVITAMIN PO) Take 1 tablet by mouth daily.    ? rosuvastatin (CRESTOR) 5 MG tablet TAKE 1 TABLET BY MOUTH ONCE DAILY 90 tablet 3  ? ?Current Facility-Administered Medications on File Prior to Visit  ?Medication Dose Route Frequency Provider Last Rate Last Admin  ? ibuprofen (ADVIL,MOTRIN) tablet 600 mg  600 mg Oral Once Melissa Owens, Gay Filler, MD      ? ? ?Review of Systems: ? ?As per HPI- otherwise negative. ? ? ?Physical Examination: ?Vitals:  ? 11/29/21 0945  ?BP: 120/78  ?Pulse: 77  ?Resp: 18  ?Temp: 97.7 ?F (36.5 ?C)  ?SpO2: 99%  ? ?Vitals:  ? 11/29/21 0945  ?Weight: 137 lb 9.6 oz (62.4 kg)  ?Height: '5\' 4"'$  (1.626 m)  ? ?Body mass index is 23.62 kg/m?. ?Ideal Body Weight: Weight in (lb) to have BMI = 25: 145.3 ? ?GEN: no acute distress. Normal weight, looks well  ?HEENT: Atraumatic, Normocephalic.  Bilateral TM wnl, oropharynx normal.  PEERL,EOMI.   ?Ears and Nose: No external deformity. ?CV: RRR, No M/G/R. No JVD. No thrill. No extra heart sounds. ?PULM: CTA B, no wheezes, crackles, rhonchi. No retractions. No resp. distress. No accessory muscle use. ?ABD: S, NT, ND. No rebound. No HSM. ?EXTR: No c/c/e ?PSYCH: Normally interactive. Conversant.  ? ? ?Assessment and Plan: ?Physical exam ? ?Screening for thyroid disorder - Plan: TSH ? ?Screening for deficiency anemia - Plan: CBC ? ?Screening for hyperlipidemia - Plan: Lipid panel ? ?Screening for diabetes mellitus - Plan: Comprehensive metabolic panel, Hemoglobin A1c ? ?Vitamin D deficiency - Plan: VITAMIN D 25 Hydroxy (Vit-D Deficiency, Fractures) ? ?Physical exam today.  Encouraged healthy diet and exercise routine ?Will plan further follow- up pending labs. ?Mood is good on current regimen ?Will send in refills as needed  ? ?Signed ?Lamar Blinks, MD ? ?Received her labs as below, message to patient ? ?Results for orders placed or performed in visit on 11/29/21  ?CBC  ?Result Value Ref Range  ? WBC 6.1 4.0 - 10.5 K/uL  ? RBC 4.02  3.87 - 5.11 Mil/uL  ? Platelets 327.0 150.0 - 400.0 K/uL  ? Hemoglobin 11.6 (L) 12.0 - 15.0 g/dL  ? HCT 34.9 (L) 36.0 - 46.0 %  ? MCV 87.0 78.0 - 100.0 fl  ? MCHC 33.3 30.0 - 36.0 g/dL  ? RDW 14.4 11.5 - 15.5 %  ?Comprehensive metabolic panel  ?Result Value Ref Range  ? Sodium 138 135 - 145 mEq/L  ? Potassium 4.2 3.5 - 5.1 mEq/L  ? Chloride 102 96 - 112 mEq/L  ? CO2 28 19 - 32 mEq/L  ? Glucose, Bld 93 70 - 99 mg/dL  ? BUN  11 6 - 23 mg/dL  ? Creatinine, Ser 0.96 0.40 - 1.20 mg/dL  ? Total Bilirubin 0.3 0.2 - 1.2 mg/dL  ? Alkaline Phosphatase 53 39 - 117 U/L  ? AST 23 0 - 37 U/L  ? ALT 21 0 - 35 U/L  ? Total Protein 6.7 6.0 - 8.3 g/dL  ? Albumin 4.4 3.5 - 5.2 g/dL  ? GFR 73.73 >60.00 mL/min  ? Calcium 9.5 8.4 - 10.5 mg/dL  ?Hemoglobin A1c  ?Result Value Ref Range  ? Hgb A1c MFr Bld 5.4 4.6 - 6.5 %  ?Lipid panel  ?Result Value Ref Range  ? Cholesterol 179 0 - 200 mg/dL  ? Triglycerides 117.0 0.0 - 149.0 mg/dL  ? HDL 62.80 >39.00 mg/dL  ? VLDL 23.4 0.0 - 40.0 mg/dL  ? LDL Cholesterol 93 0 - 99 mg/dL  ? Total CHOL/HDL Ratio 3   ? NonHDL 116.45   ?TSH  ?Result Value Ref Range  ? TSH 2.89 0.35 - 5.50 uIU/mL  ?VITAMIN D 25 Hydroxy (Vit-D Deficiency, Fractures)  ?Result Value Ref Range  ? VITD 23.62 (L) 30.00 - 100.00 ng/mL  ? ? ? ? ? ?

## 2021-11-29 ENCOUNTER — Ambulatory Visit (INDEPENDENT_AMBULATORY_CARE_PROVIDER_SITE_OTHER): Payer: No Typology Code available for payment source | Admitting: Family Medicine

## 2021-11-29 ENCOUNTER — Encounter: Payer: Self-pay | Admitting: Family Medicine

## 2021-11-29 VITALS — BP 120/78 | HR 77 | Temp 97.7°F | Resp 18 | Ht 64.0 in | Wt 137.6 lb

## 2021-11-29 DIAGNOSIS — Z1329 Encounter for screening for other suspected endocrine disorder: Secondary | ICD-10-CM | POA: Diagnosis not present

## 2021-11-29 DIAGNOSIS — E559 Vitamin D deficiency, unspecified: Secondary | ICD-10-CM

## 2021-11-29 DIAGNOSIS — Z130 Encounter for screening for diseases of the blood and blood-forming organs and certain disorders involving the immune mechanism: Secondary | ICD-10-CM | POA: Diagnosis not present

## 2021-11-29 DIAGNOSIS — Z131 Encounter for screening for diabetes mellitus: Secondary | ICD-10-CM | POA: Diagnosis not present

## 2021-11-29 DIAGNOSIS — Z0000 Encounter for general adult medical examination without abnormal findings: Secondary | ICD-10-CM | POA: Diagnosis not present

## 2021-11-29 DIAGNOSIS — Z13220 Encounter for screening for lipoid disorders: Secondary | ICD-10-CM

## 2021-11-29 DIAGNOSIS — D649 Anemia, unspecified: Secondary | ICD-10-CM

## 2021-11-29 LAB — CBC
HCT: 34.9 % — ABNORMAL LOW (ref 36.0–46.0)
Hemoglobin: 11.6 g/dL — ABNORMAL LOW (ref 12.0–15.0)
MCHC: 33.3 g/dL (ref 30.0–36.0)
MCV: 87 fl (ref 78.0–100.0)
Platelets: 327 10*3/uL (ref 150.0–400.0)
RBC: 4.02 Mil/uL (ref 3.87–5.11)
RDW: 14.4 % (ref 11.5–15.5)
WBC: 6.1 10*3/uL (ref 4.0–10.5)

## 2021-11-29 LAB — LIPID PANEL
Cholesterol: 179 mg/dL (ref 0–200)
HDL: 62.8 mg/dL (ref >39.00)
LDL Cholesterol: 93 mg/dL (ref 0–99)
NonHDL: 116.45
Total CHOL/HDL Ratio: 3
Triglycerides: 117 mg/dL (ref 0.0–149.0)
VLDL: 23.4 mg/dL (ref 0.0–40.0)

## 2021-11-29 LAB — HEMOGLOBIN A1C: Hgb A1c MFr Bld: 5.4 % (ref 4.6–6.5)

## 2021-11-29 LAB — COMPREHENSIVE METABOLIC PANEL
ALT: 21 U/L (ref 0–35)
AST: 23 U/L (ref 0–37)
Albumin: 4.4 g/dL (ref 3.5–5.2)
Alkaline Phosphatase: 53 U/L (ref 39–117)
BUN: 11 mg/dL (ref 6–23)
CO2: 28 mEq/L (ref 19–32)
Calcium: 9.5 mg/dL (ref 8.4–10.5)
Chloride: 102 mEq/L (ref 96–112)
Creatinine, Ser: 0.96 mg/dL (ref 0.40–1.20)
GFR: 73.73 mL/min (ref >60.00)
Glucose, Bld: 93 mg/dL (ref 70–99)
Potassium: 4.2 mEq/L (ref 3.5–5.1)
Sodium: 138 mEq/L (ref 135–145)
Total Bilirubin: 0.3 mg/dL (ref 0.2–1.2)
Total Protein: 6.7 g/dL (ref 6.0–8.3)

## 2021-11-29 LAB — TSH: TSH: 2.89 u[IU]/mL (ref 0.35–5.50)

## 2021-11-29 LAB — VITAMIN D 25 HYDROXY (VIT D DEFICIENCY, FRACTURES): VITD: 23.62 ng/mL — ABNORMAL LOW (ref 30.00–100.00)

## 2021-11-29 NOTE — Addendum Note (Signed)
Addended by: Lamar Blinks C on: 11/29/2021 03:17 PM ? ? Modules accepted: Orders ? ?

## 2021-12-14 ENCOUNTER — Other Ambulatory Visit (HOSPITAL_BASED_OUTPATIENT_CLINIC_OR_DEPARTMENT_OTHER): Payer: Self-pay

## 2021-12-14 MED ORDER — CARESTART COVID-19 HOME TEST VI KIT
PACK | 0 refills | Status: DC
Start: 2021-12-14 — End: 2022-01-07
  Filled 2021-12-14: qty 2, 4d supply, fill #0

## 2021-12-22 ENCOUNTER — Other Ambulatory Visit (HOSPITAL_BASED_OUTPATIENT_CLINIC_OR_DEPARTMENT_OTHER): Payer: Self-pay

## 2022-01-05 ENCOUNTER — Other Ambulatory Visit (HOSPITAL_BASED_OUTPATIENT_CLINIC_OR_DEPARTMENT_OTHER): Payer: Self-pay

## 2022-01-07 ENCOUNTER — Other Ambulatory Visit (HOSPITAL_BASED_OUTPATIENT_CLINIC_OR_DEPARTMENT_OTHER): Payer: Self-pay

## 2022-01-07 MED ORDER — CARESTART COVID-19 HOME TEST VI KIT
PACK | 0 refills | Status: DC
Start: 2022-01-07 — End: 2022-01-26
  Filled 2022-01-07: qty 4, 4d supply, fill #0

## 2022-01-26 ENCOUNTER — Other Ambulatory Visit: Payer: Self-pay | Admitting: Neurology

## 2022-01-26 ENCOUNTER — Other Ambulatory Visit (HOSPITAL_BASED_OUTPATIENT_CLINIC_OR_DEPARTMENT_OTHER): Payer: Self-pay

## 2022-01-26 MED ORDER — COVID-19 AT HOME ANTIGEN TEST VI KIT
PACK | 0 refills | Status: DC
  Filled 2022-01-26: qty 4, 8d supply, fill #0

## 2022-01-27 ENCOUNTER — Other Ambulatory Visit (HOSPITAL_BASED_OUTPATIENT_CLINIC_OR_DEPARTMENT_OTHER): Payer: Self-pay

## 2022-01-27 MED ORDER — MODAFINIL 200 MG PO TABS
ORAL_TABLET | Freq: Every day | ORAL | 1 refills | Status: DC
  Filled 2022-01-27 – 2022-02-07 (×2): qty 90, 90d supply, fill #0
  Filled 2022-05-16: qty 90, 90d supply, fill #1

## 2022-01-27 NOTE — Telephone Encounter (Signed)
Last OV was on 08/03/21.  ?Next OV is scheduled for 08/09/22.  ?Last RX was written on 11/10/21 for 90 tabs.  ? ?[place] Drug Database has been reviewed.  ?

## 2022-01-31 ENCOUNTER — Other Ambulatory Visit (HOSPITAL_BASED_OUTPATIENT_CLINIC_OR_DEPARTMENT_OTHER): Payer: Self-pay

## 2022-02-07 ENCOUNTER — Other Ambulatory Visit (HOSPITAL_BASED_OUTPATIENT_CLINIC_OR_DEPARTMENT_OTHER): Payer: Self-pay

## 2022-02-15 ENCOUNTER — Other Ambulatory Visit (HOSPITAL_BASED_OUTPATIENT_CLINIC_OR_DEPARTMENT_OTHER): Payer: Self-pay

## 2022-02-15 MED ORDER — BUPROPION HCL ER (XL) 150 MG PO TB24
ORAL_TABLET | ORAL | 1 refills | Status: DC
  Filled 2022-02-15: qty 180, 90d supply, fill #0
  Filled 2022-04-04 – 2022-05-13 (×2): qty 180, 90d supply, fill #1

## 2022-02-21 ENCOUNTER — Other Ambulatory Visit (HOSPITAL_BASED_OUTPATIENT_CLINIC_OR_DEPARTMENT_OTHER): Payer: Self-pay

## 2022-04-04 ENCOUNTER — Other Ambulatory Visit (HOSPITAL_BASED_OUTPATIENT_CLINIC_OR_DEPARTMENT_OTHER): Payer: Self-pay

## 2022-05-13 ENCOUNTER — Other Ambulatory Visit (HOSPITAL_BASED_OUTPATIENT_CLINIC_OR_DEPARTMENT_OTHER): Payer: Self-pay

## 2022-05-16 ENCOUNTER — Other Ambulatory Visit (HOSPITAL_BASED_OUTPATIENT_CLINIC_OR_DEPARTMENT_OTHER): Payer: Self-pay

## 2022-05-25 ENCOUNTER — Encounter: Payer: Self-pay | Admitting: Family Medicine

## 2022-05-25 ENCOUNTER — Telehealth: Payer: Self-pay | Admitting: Family Medicine

## 2022-05-25 DIAGNOSIS — E559 Vitamin D deficiency, unspecified: Secondary | ICD-10-CM

## 2022-05-25 DIAGNOSIS — E7849 Other hyperlipidemia: Secondary | ICD-10-CM

## 2022-05-25 DIAGNOSIS — D649 Anemia, unspecified: Secondary | ICD-10-CM

## 2022-05-25 NOTE — Telephone Encounter (Signed)
Does she need cholesterol and vit D rechecked along with the cbc?

## 2022-05-25 NOTE — Addendum Note (Signed)
Addended by: Lamar Blinks C on: 05/25/2022 12:38 PM   Modules accepted: Orders

## 2022-05-25 NOTE — Telephone Encounter (Signed)
Patient called to schedule lab appointment. Dr. Lorelei Pont wanted her to have blood count, cholesterol and vitamin D rechecked. Orders are not placed. Please call patient to advise when she can schedule lab appointment.

## 2022-05-26 ENCOUNTER — Encounter: Payer: Self-pay | Admitting: Family Medicine

## 2022-05-26 ENCOUNTER — Other Ambulatory Visit (INDEPENDENT_AMBULATORY_CARE_PROVIDER_SITE_OTHER): Payer: No Typology Code available for payment source

## 2022-05-26 DIAGNOSIS — E7849 Other hyperlipidemia: Secondary | ICD-10-CM

## 2022-05-26 DIAGNOSIS — E559 Vitamin D deficiency, unspecified: Secondary | ICD-10-CM | POA: Diagnosis not present

## 2022-05-26 DIAGNOSIS — D649 Anemia, unspecified: Secondary | ICD-10-CM | POA: Diagnosis not present

## 2022-05-26 LAB — LIPID PANEL
Cholesterol: 154 mg/dL (ref 0–200)
HDL: 52.2 mg/dL (ref >39.00)
LDL Cholesterol: 83 mg/dL (ref 0–99)
NonHDL: 101.54
Total CHOL/HDL Ratio: 3
Triglycerides: 93 mg/dL (ref 0.0–149.0)
VLDL: 18.6 mg/dL (ref 0.0–40.0)

## 2022-05-26 LAB — CBC
HCT: 40.4 % (ref 36.0–46.0)
Hemoglobin: 13.3 g/dL (ref 12.0–15.0)
MCHC: 32.9 g/dL (ref 30.0–36.0)
MCV: 90.5 fl (ref 78.0–100.0)
Platelets: 326 10*3/uL (ref 150.0–400.0)
RBC: 4.47 Mil/uL (ref 3.87–5.11)
RDW: 13.7 % (ref 11.5–15.5)
WBC: 6.4 10*3/uL (ref 4.0–10.5)

## 2022-05-26 LAB — VITAMIN D 25 HYDROXY (VIT D DEFICIENCY, FRACTURES): VITD: 57.83 ng/mL (ref 30.00–100.00)

## 2022-06-27 ENCOUNTER — Other Ambulatory Visit (HOSPITAL_BASED_OUTPATIENT_CLINIC_OR_DEPARTMENT_OTHER): Payer: Self-pay

## 2022-06-27 ENCOUNTER — Other Ambulatory Visit: Payer: Self-pay | Admitting: Family Medicine

## 2022-06-27 MED ORDER — BUPROPION HCL ER (XL) 300 MG PO TB24
300.0000 mg | ORAL_TABLET | Freq: Every day | ORAL | 1 refills | Status: DC
  Filled 2022-06-27: qty 90, 90d supply, fill #0

## 2022-08-03 ENCOUNTER — Other Ambulatory Visit (HOSPITAL_BASED_OUTPATIENT_CLINIC_OR_DEPARTMENT_OTHER): Payer: Self-pay

## 2022-08-03 MED ORDER — FLUARIX QUADRIVALENT 0.5 ML IM SUSY
PREFILLED_SYRINGE | INTRAMUSCULAR | 0 refills | Status: DC
Start: 2022-08-03 — End: 2022-12-07
  Filled 2022-08-03: qty 0.5, 1d supply, fill #0

## 2022-08-08 ENCOUNTER — Encounter: Payer: Self-pay | Admitting: Family Medicine

## 2022-08-08 NOTE — Progress Notes (Unsigned)
PATIENT: Melissa Owens DOB: [birth year]  REASON FOR VISIT: follow up HISTORY FROM: patient  Virtual Visit via Telephone Note  I connected with Stephanie Acre on 08/09/22 at  8:30 AM EST by telephone and verified that I am speaking with the correct person using two identifiers.   I discussed the limitations, risks, security and privacy concerns of performing an evaluation and management service by telephone and the availability of in person appointments. I also discussed with the patient that there may be a patient responsible charge related to this service. The patient expressed understanding and agreed to proceed.   History of Present Illness:  08/09/22 ALL ([place]): Melissa Owens returns for follow up for idopathic hypersomnolence. She continues modafinil '200mg'$  daily. She is tolerating med without obvious adverse effects. She feels hypersomnia is well controlled. She is working. She exercises regularly. She is feeling well and without concerns.   08/03/2021 ALL ([place]): Melissa Owens is a 41 y.o. female here today for follow up for idiopathic hypersomnolence. She continues modafinil '200mg'$  daily. She continues to note benefit. No significant changes. She recently restarted Wellbutrin and is tolerating well. She is feeling well and denies concerns, today.   07/07/20 ALL:  Melissa Owens is a 41 y.o. female here today for follow up for idiopathic hypersomnolence.  Narcolepsy gene positive, hoever, MSLT showed "This multiple sleep latency test reveals a mean sleep latency of 15.8 minutes with 3 out of 5 nap opportunities during which no sleep was recorded. 2. There were no sleep periods during which REM sleep was recorded". She has continued modafinil '200mg'$ . She feels that this definitely helps. Occasionally she will take 1/2 tablet if she isn't busy. She denies adverse effects. She is feeling well today. She had labs with PCP this morning.   Observations/Objective:  Generalized: Well developed, in no  acute distress  Mentation: Alert oriented to time, place, history taking. Follows all commands speech and language fluent   Assessment and Plan:  41 y.o. year old female  has a past medical history of Chicken pox, Exercise-induced asthma (11/21/2017), High cholesterol, Migraines, Urinary tract infection, and Vertebral artery dissection ([place]) (08/2018). here with    ICD-10-CM   1. Idiopathic hypersomnolence  G47.11        Melissa Owens continues to do well on modafinil. PDMP shows appropriate refills, last refilled 07/02/2021 for 90 days. She will call when she needs a refill. Healthy lifestyle habits advised. She will follow up in 1 year, sooner if needed.   No orders of the defined types were placed in this encounter.    Meds ordered this encounter  Medications   modafinil (PROVIGIL) 200 MG tablet    Sig: TAKE 1 TABLET (200 MG TOTAL) BY MOUTH DAILY.    Dispense:  90 tablet    Refill:  1    Order Specific Question:   Supervising Provider    Answer:   Melvenia Beam [id number]      Follow Up Instructions:  I discussed the assessment and treatment plan with the patient. The patient was provided an opportunity to ask questions and all were answered. The patient agreed with the plan and demonstrated an understanding of the instructions.   The patient was advised to call back or seek an in-person evaluation if the symptoms worsen or if the condition fails to improve as anticipated.  I provided 15 minutes of non-face-to-face time during this encounter. Patient located at their place of residence during [place] visit. Provider is in  the office.    Debbora Presto, NP

## 2022-08-08 NOTE — Patient Instructions (Signed)
Below is our plan:  We will continue modafinil 200mg daily   Please make sure you are staying well hydrated. I recommend 50-60 ounces daily. Well balanced diet and regular exercise encouraged. Consistent sleep schedule with 6-8 hours recommended.   Please continue follow up with care team as directed.   Follow up with me in 1 year   You may receive a survey regarding today's visit. I encourage you to leave honest feed back as I do use this information to improve patient care. Thank you for seeing me today!    

## 2022-08-09 ENCOUNTER — Other Ambulatory Visit (HOSPITAL_BASED_OUTPATIENT_CLINIC_OR_DEPARTMENT_OTHER): Payer: Self-pay

## 2022-08-09 ENCOUNTER — Telehealth (INDEPENDENT_AMBULATORY_CARE_PROVIDER_SITE_OTHER): Payer: No Typology Code available for payment source | Admitting: Family Medicine

## 2022-08-09 ENCOUNTER — Encounter: Payer: Self-pay | Admitting: Family Medicine

## 2022-08-09 DIAGNOSIS — G4711 Idiopathic hypersomnia with long sleep time: Secondary | ICD-10-CM | POA: Diagnosis not present

## 2022-08-09 MED ORDER — MODAFINIL 200 MG PO TABS
200.0000 mg | ORAL_TABLET | Freq: Every day | ORAL | 1 refills | Status: DC
  Filled 2022-08-09: qty 90, 90d supply, fill #0
  Filled 2023-01-23: qty 90, 90d supply, fill #1

## 2022-08-13 ENCOUNTER — Other Ambulatory Visit (HOSPITAL_BASED_OUTPATIENT_CLINIC_OR_DEPARTMENT_OTHER): Payer: Self-pay

## 2022-08-13 MED ORDER — PAXLOVID (300/100) 20 X 150 MG & 10 X 100MG PO TBPK
ORAL_TABLET | ORAL | 0 refills | Status: DC
  Filled 2022-08-13: qty 30, 5d supply, fill #0

## 2022-08-15 ENCOUNTER — Other Ambulatory Visit (HOSPITAL_BASED_OUTPATIENT_CLINIC_OR_DEPARTMENT_OTHER): Payer: Self-pay

## 2022-08-16 ENCOUNTER — Other Ambulatory Visit (HOSPITAL_BASED_OUTPATIENT_CLINIC_OR_DEPARTMENT_OTHER): Payer: Self-pay

## 2022-08-24 ENCOUNTER — Other Ambulatory Visit: Payer: Self-pay | Admitting: Family Medicine

## 2022-08-24 ENCOUNTER — Other Ambulatory Visit: Payer: Self-pay

## 2022-08-24 ENCOUNTER — Other Ambulatory Visit (HOSPITAL_BASED_OUTPATIENT_CLINIC_OR_DEPARTMENT_OTHER): Payer: Self-pay

## 2022-08-24 MED ORDER — ROSUVASTATIN CALCIUM 5 MG PO TABS
5.0000 mg | ORAL_TABLET | Freq: Every day | ORAL | 1 refills | Status: DC
  Filled 2022-08-24: qty 90, 90d supply, fill #0
  Filled 2022-11-28: qty 90, 90d supply, fill #1

## 2022-09-15 ENCOUNTER — Other Ambulatory Visit: Payer: Self-pay | Admitting: Sports Medicine

## 2022-09-15 ENCOUNTER — Other Ambulatory Visit (HOSPITAL_BASED_OUTPATIENT_CLINIC_OR_DEPARTMENT_OTHER): Payer: Self-pay

## 2022-09-15 ENCOUNTER — Other Ambulatory Visit: Payer: Self-pay

## 2022-09-15 MED ORDER — OSELTAMIVIR PHOSPHATE 75 MG PO CAPS
75.0000 mg | ORAL_CAPSULE | Freq: Every day | ORAL | 0 refills | Status: DC
  Filled 2022-09-15: qty 10, 10d supply, fill #0

## 2022-09-16 ENCOUNTER — Other Ambulatory Visit (HOSPITAL_BASED_OUTPATIENT_CLINIC_OR_DEPARTMENT_OTHER): Payer: Self-pay

## 2022-09-20 ENCOUNTER — Other Ambulatory Visit: Payer: Self-pay

## 2022-09-26 ENCOUNTER — Other Ambulatory Visit: Payer: Self-pay

## 2022-09-26 ENCOUNTER — Other Ambulatory Visit (HOSPITAL_COMMUNITY): Payer: Self-pay

## 2022-11-23 NOTE — Progress Notes (Deleted)
[place] at [facility] [address], [place], [place] [address] [phone] [id number] [phone]  Date:  12/05/2022   Name:  Melissa Owens   DOB:  1980-09-27   MRN:  [id number]  PCP:  Darreld Mclean, MD    Chief Complaint: No chief complaint on file.   History of Present Illness:  Melissa Owens is a 42 y.o. 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   She is on low-dose Crestor for lipid management- she is tolerating well  She is using modafanil and it works well for her   Married to them, they have 3 school-aged children  At our last visit she was doing well with Wellbutrin for depression Pap screening up-to-date Lab work done 1 year ago except more recent CBC, lipid, vitamin D completed in September  Wellbutrin XL 300 Modafinil 200 mg daily  Crestor 5 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   Family history of narcolepsy 02/18/2019   Excessive daytime sleepiness 02/18/2019   Dissection of vertebral artery ([place]) 02/18/2019   Seasonal affective disorder ([place]) 11/21/2017   Familial hyperlipidemia 11/21/2017   Exercise-induced asthma 11/21/2017    Past Medical History:  Diagnosis Date   Chicken pox    Exercise-induced asthma 11/21/2017   High cholesterol    Migraines    Urinary tract infection    Vertebral artery dissection ([place]) 08/2018   resolved    Past Surgical History:  Procedure Laterality Date   CESAREAN SECTION  2009   IR ANGIO INTRA EXTRACRAN SEL COM CAROTID INNOMINATE BILAT MOD SED  08/30/2018   IR ANGIO VERTEBRAL SEL VERTEBRAL BILAT MOD SED  08/30/2018   WISDOM TOOTH EXTRACTION      Social History   Tobacco Use   Smoking  status: Never   Smokeless tobacco: Never  Substance Use Topics   Alcohol use: Yes    Alcohol/week: 1.0 - 2.0 standard drink of alcohol    Types: 1 - 2 Glasses of wine per week   Drug use: Never    Family History  Problem Relation Age of Onset   Hypertension Mother    High Cholesterol Mother    Cancer Father    Heart attack Father    Heart disease Father    Arthritis Maternal Grandmother    Cancer Maternal Grandmother    High Cholesterol Maternal Grandmother    High blood pressure Maternal Grandmother    Stroke Maternal Grandmother    Arthritis Maternal Grandfather    Diabetes Maternal Grandfather    Hearing loss Maternal Grandfather    High Cholesterol Maternal Grandfather    High blood pressure Maternal Grandfather    Asthma Paternal Grandmother    Diabetes Paternal Grandmother    Miscarriages / Stillbirths Paternal Grandmother    COPD Paternal Grandfather    Alcohol abuse Brother    Drug abuse Brother     Allergies  Allergen Reactions   Sulfa Antibiotics Hives    Medication list has been reviewed and updated.  Current Outpatient Medications on File Prior to Visit  Medication Sig Dispense Refill   albuterol (VENTOLIN HFA) 108 (90 Base) MCG/ACT inhaler Inhale 1-2 puffs into the lungs every 4 (  four) hours as needed for wheezing or shortness of breath. 18 Inhaler 1   aspirin EC 81 MG tablet Take 81 mg by mouth daily.     buPROPion (WELLBUTRIN XL) 300 MG 24 hr tablet Take 1 tablet (300 mg total) by mouth daily. 90 tablet 1   COVID-19 At Home Antigen Test (CARESTART COVID-19 HOME TEST) KIT Use as directed 4 kit 0   influenza vac split quadrivalent PF (FLUARIX QUADRIVALENT) 0.5 ML injection Inject into the muscle. 0.5 mL 0   influenza vac split quadrivalent PF (FLUARIX) 0.5 ML injection Inject into the muscle. 0.5 mL 0   modafinil (PROVIGIL) 200 MG tablet Take 1 tablet (200 mg total) by mouth daily. 90 tablet 1   Multiple Vitamins-Minerals (WOMENS MULTIVITAMIN PO) Take 1  tablet by mouth daily.     nirmatrelvir & ritonavir (PAXLOVID, 300/100,) 20 x 150 MG & 10 x '100MG'$  TBPK Take as directed per the package 30 tablet 0   oseltamivir (TAMIFLU) 75 MG capsule Take 1 capsule (75 mg total) by mouth daily. 10 capsule 0   rosuvastatin (CRESTOR) 5 MG tablet Take 1 tablet (5 mg total) by mouth daily. 90 tablet 1   Current Facility-Administered Medications on File Prior to Visit  Medication Dose Route Frequency Provider Last Rate Last Admin   ibuprofen (ADVIL,MOTRIN) tablet 600 mg  600 mg Oral Once Leelynn Whetsel, Gay Filler, MD        Review of Systems:  As per HPI- otherwise negative.   Physical Examination: There were no vitals filed for this visit. There were no vitals filed for this visit. There is no height or weight on file to calculate BMI. Ideal Body Weight:    GEN: no acute distress. HEENT: Atraumatic, Normocephalic.  Ears and Nose: No external deformity. CV: RRR, No M/G/R. No JVD. No thrill. No extra heart sounds. PULM: CTA B, no wheezes, crackles, rhonchi. No retractions. No resp. distress. No accessory muscle use. ABD: S, NT, ND, +BS. No rebound. No HSM. EXTR: No c/c/e PSYCH: Normally interactive. Conversant.    Assessment and Plan: ***  Signed Lamar Blinks, MD

## 2022-11-28 ENCOUNTER — Other Ambulatory Visit: Payer: Self-pay

## 2022-12-01 NOTE — Patient Instructions (Addendum)
Great to see you again today!  I will be in touch with your labs asap Happy spring!

## 2022-12-01 NOTE — Progress Notes (Addendum)
[place] at [facility] [address], [place], [place] [address] [phone]  Date:  12/07/2022   Name:  Melissa Owens   DOB:  10-24-1980   MRN:  [id number]  PCP:  Darreld Mclean, MD    Chief Complaint: Annual Exam (Concerns/ questions: none/HIV screen due)   History of Present Illness:  Melissa Owens is a 42 y.o. very pleasant female patient who presents with the following:  Pt seen today for a CPE Last seen by myself about one year ago She is also seen by Dr Pamala Hurry with GYN and Debbora Presto, NP with neurology   Generally healthy young woman with history of vertebral artery dissection noted in 2019 and healed on repeat CT angio 2020, stress-induced asthma and hypersomnolence.   She is on low-dose Crestor for lipid management- she is tolerating well  She is using modafanil and it works well for her - neurology visit 11/23  Married to Melissa Owens, Software engineer They have children ages 67,14, 13  Pap and mammo per GYN Labs can be updated today  States no concern of pregnancy today   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   Family history of narcolepsy 02/18/2019   Excessive daytime sleepiness 02/18/2019   Dissection of vertebral artery ([place]) 02/18/2019   Seasonal affective disorder ([place]) 11/21/2017   Familial hyperlipidemia 11/21/2017   Exercise-induced asthma 11/21/2017    Past Medical History:  Diagnosis Date   Chicken pox    Exercise-induced asthma 11/21/2017   High cholesterol    Migraines    Urinary tract infection    Vertebral artery dissection ([place]) 08/2018   resolved    Past Surgical History:  Procedure Laterality Date   CESAREAN SECTION  2009   IR ANGIO INTRA EXTRACRAN SEL COM CAROTID INNOMINATE BILAT MOD SED  08/30/2018   IR ANGIO VERTEBRAL SEL VERTEBRAL BILAT MOD SED  08/30/2018   WISDOM TOOTH EXTRACTION      Social History   Tobacco Use   Smoking  status: Never   Smokeless tobacco: Never  Substance Use Topics   Alcohol use: Yes    Alcohol/week: 1.0 - 2.0 standard drink of alcohol    Types: 1 - 2 Glasses of wine per week   Drug use: Never    Family History  Problem Relation Age of Onset   Hypertension Mother    High Cholesterol Mother    Cancer Father    Heart attack Father    Heart disease Father    Arthritis Maternal Grandmother    Cancer Maternal Grandmother    High Cholesterol Maternal Grandmother    High blood pressure Maternal Grandmother    Stroke Maternal Grandmother    Arthritis Maternal Grandfather    Diabetes Maternal Grandfather    Hearing loss Maternal Grandfather    High Cholesterol Maternal Grandfather    High blood pressure Maternal Grandfather    Asthma Paternal Grandmother    Diabetes Paternal Grandmother    Miscarriages / Stillbirths Paternal Grandmother    COPD Paternal Grandfather    Alcohol abuse Brother    Drug abuse Brother     Allergies  Allergen Reactions   Sulfa Antibiotics Hives    Medication list has been reviewed and updated.  Current Outpatient Medications on File Prior to Visit  Medication Sig Dispense Refill   albuterol (VENTOLIN HFA) 108 (90 Base) MCG/ACT inhaler Inhale 1-2 puffs into the lungs  every 4 (four) hours as needed for wheezing or shortness of breath. 18 Inhaler 1   aspirin EC 81 MG tablet Take 81 mg by mouth daily.     modafinil (PROVIGIL) 200 MG tablet Take 1 tablet (200 mg total) by mouth daily. 90 tablet 1   Multiple Vitamins-Minerals (WOMENS MULTIVITAMIN PO) Take 1 tablet by mouth daily.     Current Facility-Administered Medications on File Prior to Visit  Medication Dose Route Frequency Provider Last Rate Last Admin   ibuprofen (ADVIL,MOTRIN) tablet 600 mg  600 mg Oral Once Donetta Isaza, Gay Filler, MD        Review of Systems:  As per HPI- otherwise negative.   Physical Examination: Vitals:   12/07/22 1127  BP: 118/80  Pulse: 81  Resp: 18  Temp: 97.8 F  (36.6 C)  SpO2: 98%   Vitals:   12/07/22 1127  Weight: 133 lb 6.4 oz (60.5 kg)  Height: 5\' 3"  (1.6 m)   Body mass index is 23.63 kg/m. Ideal Body Weight: Weight in (lb) to have BMI = 25: 140.8  GEN: no acute distress. Normal weight, looks well  HEENT: Atraumatic, Normocephalic.  Bilateral TM wnl, oropharynx normal.  PEERL,EOMI.   Ears and Nose: No external deformity. CV: RRR, No M/G/R. No JVD. No thrill. No extra heart sounds. PULM: CTA B, no wheezes, crackles, rhonchi. No retractions. No resp. distress. No accessory muscle use. ABD: S, NT, ND, +BS. No rebound. No HSM. EXTR: No c/c/e PSYCH: Normally interactive. Conversant.    Assessment and Plan: Physical exam  Familial hyperlipidemia - Plan: Lipid panel, rosuvastatin (CRESTOR) 5 MG tablet  Screening for diabetes mellitus - Plan: Comprehensive metabolic panel, Hemoglobin A1c  Fatigue, unspecified type - Plan: CBC  Thyroid disorder screening - Plan: TSH  Seasonal affective disorder ([place]) - Plan: buPROPion (WELLBUTRIN XL) 300 MG 24 hr tablet  Immunization due - Plan: Td vaccine greater than or equal to 7yo preservative free IM Physical exam today- encouraged healthy diet and exercise routine  She is doing well with wellbutrin- refilled for her today No further issues with vascular dissection- she is on a baby asa and crestor Will plan further follow- up pending labs. Update tetanus today  Signed Lamar Blinks, MD  Received her labs as below, message to patientt  Results for orders placed or performed in visit on 12/07/22  CBC  Result Value Ref Range   WBC 6.3 4.0 - 10.5 K/uL   RBC 4.45 3.87 - 5.11 Mil/uL   Platelets 426.0 (H) 150.0 - 400.0 K/uL   Hemoglobin 12.8 12.0 - 15.0 g/dL   HCT 38.5 36.0 - 46.0 %   MCV 86.4 78.0 - 100.0 fl   MCHC 33.2 30.0 - 36.0 g/dL   RDW 13.4 11.5 - 15.5 %  Comprehensive metabolic panel  Result Value Ref Range   Sodium 137 135 - 145 mEq/L   Potassium 4.3 3.5 - 5.1 mEq/L    Chloride 101 96 - 112 mEq/L   CO2 28 19 - 32 mEq/L   Glucose, Bld 84 70 - 99 mg/dL   BUN 12 6 - 23 mg/dL   Creatinine, Ser 0.94 0.40 - 1.20 mg/dL   Total Bilirubin 0.5 0.2 - 1.2 mg/dL   Alkaline Phosphatase 65 39 - 117 U/L   AST 28 0 - 37 U/L   ALT 23 0 - 35 U/L   Total Protein 7.1 6.0 - 8.3 g/dL   Albumin 4.3 3.5 - 5.2 g/dL   GFR 75.07 >60.00  mL/min   Calcium 9.7 8.4 - 10.5 mg/dL  Hemoglobin A1c  Result Value Ref Range   Hgb A1c MFr Bld 5.5 4.6 - 6.5 %  Lipid panel  Result Value Ref Range   Cholesterol 174 0 - 200 mg/dL   Triglycerides 57.0 0.0 - 149.0 mg/dL   HDL 69.00 >39.00 mg/dL   VLDL 11.4 0.0 - 40.0 mg/dL   LDL Cholesterol 93 0 - 99 mg/dL   Total CHOL/HDL Ratio 3    NonHDL 104.62   TSH  Result Value Ref Range   TSH 1.82 0.35 - 5.50 uIU/mL

## 2022-12-05 ENCOUNTER — Encounter: Payer: No Typology Code available for payment source | Admitting: Family Medicine

## 2022-12-05 DIAGNOSIS — E559 Vitamin D deficiency, unspecified: Secondary | ICD-10-CM

## 2022-12-05 DIAGNOSIS — D649 Anemia, unspecified: Secondary | ICD-10-CM

## 2022-12-05 DIAGNOSIS — Z0000 Encounter for general adult medical examination without abnormal findings: Secondary | ICD-10-CM

## 2022-12-05 DIAGNOSIS — I7774 Dissection of vertebral artery: Secondary | ICD-10-CM

## 2022-12-05 DIAGNOSIS — E7849 Other hyperlipidemia: Secondary | ICD-10-CM

## 2022-12-05 DIAGNOSIS — G4719 Other hypersomnia: Secondary | ICD-10-CM

## 2022-12-07 ENCOUNTER — Encounter: Payer: Self-pay | Admitting: Family Medicine

## 2022-12-07 ENCOUNTER — Other Ambulatory Visit (HOSPITAL_BASED_OUTPATIENT_CLINIC_OR_DEPARTMENT_OTHER): Payer: Self-pay

## 2022-12-07 ENCOUNTER — Ambulatory Visit (INDEPENDENT_AMBULATORY_CARE_PROVIDER_SITE_OTHER): Payer: 59 | Admitting: Family Medicine

## 2022-12-07 VITALS — BP 118/80 | HR 81 | Temp 97.8°F | Resp 18 | Ht 63.0 in | Wt 133.4 lb

## 2022-12-07 DIAGNOSIS — E7849 Other hyperlipidemia: Secondary | ICD-10-CM

## 2022-12-07 DIAGNOSIS — Z1329 Encounter for screening for other suspected endocrine disorder: Secondary | ICD-10-CM

## 2022-12-07 DIAGNOSIS — Z23 Encounter for immunization: Secondary | ICD-10-CM

## 2022-12-07 DIAGNOSIS — R5383 Other fatigue: Secondary | ICD-10-CM | POA: Diagnosis not present

## 2022-12-07 DIAGNOSIS — Z131 Encounter for screening for diabetes mellitus: Secondary | ICD-10-CM | POA: Diagnosis not present

## 2022-12-07 DIAGNOSIS — Z0001 Encounter for general adult medical examination with abnormal findings: Secondary | ICD-10-CM | POA: Diagnosis not present

## 2022-12-07 DIAGNOSIS — Z0000 Encounter for general adult medical examination without abnormal findings: Secondary | ICD-10-CM

## 2022-12-07 DIAGNOSIS — F338 Other recurrent depressive disorders: Secondary | ICD-10-CM

## 2022-12-07 LAB — LIPID PANEL
Cholesterol: 174 mg/dL (ref 0–200)
HDL: 69 mg/dL (ref >39.00)
LDL Cholesterol: 93 mg/dL (ref 0–99)
NonHDL: 104.62
Total CHOL/HDL Ratio: 3
Triglycerides: 57 mg/dL (ref 0.0–149.0)
VLDL: 11.4 mg/dL (ref 0.0–40.0)

## 2022-12-07 LAB — COMPREHENSIVE METABOLIC PANEL
ALT: 23 U/L (ref 0–35)
AST: 28 U/L (ref 0–37)
Albumin: 4.3 g/dL (ref 3.5–5.2)
Alkaline Phosphatase: 65 U/L (ref 39–117)
BUN: 12 mg/dL (ref 6–23)
CO2: 28 mEq/L (ref 19–32)
Calcium: 9.7 mg/dL (ref 8.4–10.5)
Chloride: 101 mEq/L (ref 96–112)
Creatinine, Ser: 0.94 mg/dL (ref 0.40–1.20)
GFR: 75.07 mL/min (ref >60.00)
Glucose, Bld: 84 mg/dL (ref 70–99)
Potassium: 4.3 mEq/L (ref 3.5–5.1)
Sodium: 137 mEq/L (ref 135–145)
Total Bilirubin: 0.5 mg/dL (ref 0.2–1.2)
Total Protein: 7.1 g/dL (ref 6.0–8.3)

## 2022-12-07 LAB — CBC
HCT: 38.5 % (ref 36.0–46.0)
Hemoglobin: 12.8 g/dL (ref 12.0–15.0)
MCHC: 33.2 g/dL (ref 30.0–36.0)
MCV: 86.4 fl (ref 78.0–100.0)
Platelets: 426 10*3/uL — ABNORMAL HIGH (ref 150.0–400.0)
RBC: 4.45 Mil/uL (ref 3.87–5.11)
RDW: 13.4 % (ref 11.5–15.5)
WBC: 6.3 10*3/uL (ref 4.0–10.5)

## 2022-12-07 LAB — HEMOGLOBIN A1C: Hgb A1c MFr Bld: 5.5 % (ref 4.6–6.5)

## 2022-12-07 LAB — TSH: TSH: 1.82 u[IU]/mL (ref 0.35–5.50)

## 2022-12-07 MED ORDER — BUPROPION HCL ER (XL) 300 MG PO TB24
300.0000 mg | ORAL_TABLET | Freq: Every day | ORAL | 3 refills | Status: DC
  Filled 2022-12-07 – 2023-01-03 (×2): qty 90, 90d supply, fill #0
  Filled 2023-04-05: qty 90, 90d supply, fill #1
  Filled 2023-07-11: qty 90, 90d supply, fill #2
  Filled 2023-10-16: qty 90, 90d supply, fill #3

## 2022-12-07 MED ORDER — ROSUVASTATIN CALCIUM 5 MG PO TABS
5.0000 mg | ORAL_TABLET | Freq: Every day | ORAL | 3 refills | Status: DC
  Filled 2022-12-07 – 2023-02-22 (×3): qty 90, 90d supply, fill #0
  Filled 2023-06-06: qty 90, 90d supply, fill #1
  Filled 2023-09-11: qty 90, 90d supply, fill #2

## 2022-12-15 ENCOUNTER — Other Ambulatory Visit (HOSPITAL_BASED_OUTPATIENT_CLINIC_OR_DEPARTMENT_OTHER): Payer: Self-pay

## 2022-12-16 ENCOUNTER — Other Ambulatory Visit (HOSPITAL_BASED_OUTPATIENT_CLINIC_OR_DEPARTMENT_OTHER): Payer: Self-pay

## 2023-01-03 ENCOUNTER — Other Ambulatory Visit: Payer: Self-pay

## 2023-01-16 DIAGNOSIS — L570 Actinic keratosis: Secondary | ICD-10-CM | POA: Diagnosis not present

## 2023-01-16 DIAGNOSIS — L578 Other skin changes due to chronic exposure to nonionizing radiation: Secondary | ICD-10-CM | POA: Diagnosis not present

## 2023-01-16 DIAGNOSIS — L814 Other melanin hyperpigmentation: Secondary | ICD-10-CM | POA: Diagnosis not present

## 2023-01-16 DIAGNOSIS — Z86018 Personal history of other benign neoplasm: Secondary | ICD-10-CM | POA: Diagnosis not present

## 2023-01-16 DIAGNOSIS — D225 Melanocytic nevi of trunk: Secondary | ICD-10-CM | POA: Diagnosis not present

## 2023-01-16 DIAGNOSIS — Q825 Congenital non-neoplastic nevus: Secondary | ICD-10-CM | POA: Diagnosis not present

## 2023-01-16 DIAGNOSIS — D2271 Melanocytic nevi of right lower limb, including hip: Secondary | ICD-10-CM | POA: Diagnosis not present

## 2023-01-23 ENCOUNTER — Other Ambulatory Visit (HOSPITAL_COMMUNITY): Payer: Self-pay

## 2023-01-23 ENCOUNTER — Other Ambulatory Visit: Payer: Self-pay

## 2023-01-24 ENCOUNTER — Other Ambulatory Visit (HOSPITAL_BASED_OUTPATIENT_CLINIC_OR_DEPARTMENT_OTHER): Payer: Self-pay

## 2023-01-24 ENCOUNTER — Other Ambulatory Visit: Payer: Self-pay

## 2023-02-22 ENCOUNTER — Other Ambulatory Visit: Payer: Self-pay

## 2023-02-22 ENCOUNTER — Other Ambulatory Visit: Payer: Self-pay | Admitting: Family Medicine

## 2023-02-22 ENCOUNTER — Other Ambulatory Visit (HOSPITAL_COMMUNITY): Payer: Self-pay

## 2023-02-22 NOTE — Telephone Encounter (Signed)
Requested Prescriptions   Pending Prescriptions Disp Refills   modafinil (PROVIGIL) 200 MG tablet 90 tablet 1    Sig: Take 1 tablet (200 mg total) by mouth daily.   Last seen 08/09/22, next visit has not been scheduled but due to recheck 08/10/23 Mccue stated: Below is our plan:   We will continue modafinil 200mg  daily.     They just received a 90 day supply so I am refusing this refill  Dispenses   Dispensed Days Supply Quantity Provider Pharmacy  modafinil (PROVIGIL) 200 MG tablet 01/24/2023 90 90 tablet Lomax, Amy, NP [facility]  modafinil (PROVIGIL) 200 MG tablet 08/09/2022 90 90 tablet Lomax, Amy, NP [facility] -...  modafinil (PROVIGIL) 200 MG tablet 05/16/2022 90 90 tablet Dohmeier, Porfirio Mylar, MD [facility] -.Marland KitchenMarland Kitchen

## 2023-02-23 ENCOUNTER — Other Ambulatory Visit: Payer: Self-pay

## 2023-02-23 ENCOUNTER — Other Ambulatory Visit (HOSPITAL_COMMUNITY): Payer: Self-pay

## 2023-02-23 ENCOUNTER — Other Ambulatory Visit (HOSPITAL_BASED_OUTPATIENT_CLINIC_OR_DEPARTMENT_OTHER): Payer: Self-pay

## 2023-02-23 ENCOUNTER — Other Ambulatory Visit: Payer: Self-pay | Admitting: Family Medicine

## 2023-02-27 ENCOUNTER — Other Ambulatory Visit (HOSPITAL_COMMUNITY): Payer: Self-pay

## 2023-03-04 NOTE — Progress Notes (Deleted)
[facility] at [facility] [address], [address] [place], [place] [address] [phone]  Date:  03/06/2023   Name:  Melissa Owens   DOB:  17-Nov-1980   MRN:  [id number]  PCP:  Pearline Cables, MD    Chief Complaint: No chief complaint on file.   History of Present Illness:  Melissa Owens is a 42 y.o. very pleasant female patient who presents with the following:  Pt seen today with concern of headache Last visit with myself was in March of this year for her CPE  She is also seen by Dr Ernestina Penna with GYN and Shawnie Dapper, NP with neurology  Generally healthy young woman with history of vertebral artery dissection noted in 2019 and healed on repeat CT angio 2020, stress-induced asthma and hypersomnolence.   She is on low-dose Crestor for lipid management- she is tolerating well  She is using modafanil for hypersomnolence and it works well for her - neurology visit 11/23  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   Seasonal affective disorder ([facility]) 11/21/2017   Familial hyperlipidemia 11/21/2017   Exercise-induced asthma 11/21/2017    Past Medical History:  Diagnosis Date   Chicken pox    Exercise-induced asthma 11/21/2017   High cholesterol    Migraines    Urinary tract infection    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   resolved    Past Surgical History:  Procedure Laterality Date   CESAREAN SECTION  2009   IR ANGIO INTRA EXTRACRAN SEL COM CAROTID INNOMINATE BILAT MOD SED  08/30/2018   IR ANGIO VERTEBRAL SEL VERTEBRAL BILAT MOD SED  08/30/2018   WISDOM TOOTH EXTRACTION      Social History   Tobacco Use   Smoking status: Never   Smokeless tobacco: Never  Substance Use Topics   Alcohol use: Yes    Alcohol/week: 1.0 - 2.0 standard drink of  alcohol    Types: 1 - 2 Glasses of wine per week   Drug use: Never    Family History  Problem Relation Age of Onset   Hypertension Mother    High Cholesterol Mother    Cancer Father    Heart attack Father    Heart disease Father    Arthritis Maternal Grandmother    Cancer Maternal Grandmother    High Cholesterol Maternal Grandmother    High blood pressure Maternal Grandmother    Stroke Maternal Grandmother    Arthritis Maternal Grandfather    Diabetes Maternal Grandfather    Hearing loss Maternal Grandfather    High Cholesterol Maternal Grandfather    High blood pressure Maternal Grandfather    Asthma Paternal Grandmother    Diabetes Paternal Grandmother    Miscarriages / Stillbirths Paternal Grandmother    COPD Paternal Grandfather    Alcohol abuse Brother    Drug abuse Brother     Allergies  Allergen Reactions   Sulfa Antibiotics Hives    Medication list has been reviewed and updated.  Current Outpatient Medications on File Prior to Visit  Medication Sig Dispense Refill   albuterol (VENTOLIN HFA) 108 (90 Base) MCG/ACT inhaler Inhale 1-2 puffs into the lungs every 4 (four) hours as needed for wheezing or shortness of breath. 18 Inhaler 1   aspirin EC 81 MG tablet Take 81  mg by mouth daily.     buPROPion (WELLBUTRIN XL) 300 MG 24 hr tablet Take 1 tablet (300 mg total) by mouth daily. 90 tablet 3   modafinil (PROVIGIL) 200 MG tablet Take 1 tablet (200 mg total) by mouth daily. 90 tablet 1   Multiple Vitamins-Minerals (WOMENS MULTIVITAMIN PO) Take 1 tablet by mouth daily.     rosuvastatin (CRESTOR) 5 MG tablet Take 1 tablet (5 mg total) by mouth daily. 90 tablet 3   Current Facility-Administered Medications on File Prior to Visit  Medication Dose Route Frequency Provider Last Rate Last Admin   ibuprofen (ADVIL,MOTRIN) tablet 600 mg  600 mg Oral Once Tishia Maestre, Gwenlyn Found, MD        Review of Systems:  As per HPI- otherwise negative.   Physical Examination: There  were no vitals filed for this visit. There were no vitals filed for this visit. There is no height or weight on file to calculate BMI. Ideal Body Weight:    GEN: no acute distress. HEENT: Atraumatic, Normocephalic.  Ears and Nose: No external deformity. CV: RRR, No M/G/R. No JVD. No thrill. No extra heart sounds. PULM: CTA B, no wheezes, crackles, rhonchi. No retractions. No resp. distress. No accessory muscle use. ABD: S, NT, ND, +BS. No rebound. No HSM. EXTR: No c/c/e PSYCH: Normally interactive. Conversant.    Assessment and Plan: ***  Signed Abbe Amsterdam, MD

## 2023-03-05 NOTE — Progress Notes (Deleted)
[facility] at [facility] [address], [address] [place], [place] [address] [phone]  Date:  03/06/2023   Name:  ALLE CELLI   DOB:  November 19, 1980   MRN:  [id number]  PCP:  Pearline Cables, MD    Chief Complaint: No chief complaint on file.   History of Present Illness:  KATHERINNE TERLECKI is a 42 y.o. very pleasant female patient who presents with the following:  Pt seen today with concern of headache Last visit with myself was in March of this year for her CPE  She is also seen by Dr Ernestina Penna with GYN and Shawnie Dapper, NP with neurology  Generally healthy young woman with history of vertebral artery dissection noted in 2019 and healed on repeat CT angio 2020, stress-induced asthma and hypersomnolence.   She is on low-dose Crestor for lipid management- she is tolerating well  She is using modafanil for hypersomnolence and it works well for her - neurology visit 11/23  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   Seasonal affective disorder ([facility]) 11/21/2017   Familial hyperlipidemia 11/21/2017   Exercise-induced asthma 11/21/2017    Past Medical History:  Diagnosis Date   Chicken pox    Exercise-induced asthma 11/21/2017   High cholesterol    Migraines    Urinary tract infection    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   resolved    Past Surgical History:  Procedure Laterality Date   CESAREAN SECTION  2009   IR ANGIO INTRA EXTRACRAN SEL COM CAROTID INNOMINATE BILAT MOD SED  08/30/2018   IR ANGIO VERTEBRAL SEL VERTEBRAL BILAT MOD SED  08/30/2018   WISDOM TOOTH EXTRACTION      Social History   Tobacco Use   Smoking status: Never   Smokeless tobacco: Never  Substance Use Topics   Alcohol use: Yes    Alcohol/week: 1.0 - 2.0 standard drink of  alcohol    Types: 1 - 2 Glasses of wine per week   Drug use: Never    Family History  Problem Relation Age of Onset   Hypertension Mother    High Cholesterol Mother    Cancer Father    Heart attack Father    Heart disease Father    Arthritis Maternal Grandmother    Cancer Maternal Grandmother    High Cholesterol Maternal Grandmother    High blood pressure Maternal Grandmother    Stroke Maternal Grandmother    Arthritis Maternal Grandfather    Diabetes Maternal Grandfather    Hearing loss Maternal Grandfather    High Cholesterol Maternal Grandfather    High blood pressure Maternal Grandfather    Asthma Paternal Grandmother    Diabetes Paternal Grandmother    Miscarriages / Stillbirths Paternal Grandmother    COPD Paternal Grandfather    Alcohol abuse Brother    Drug abuse Brother     Allergies  Allergen Reactions   Sulfa Antibiotics Hives    Medication list has been reviewed and updated.  Current Outpatient Medications on File Prior to Visit  Medication Sig Dispense Refill   albuterol (VENTOLIN HFA) 108 (90 Base) MCG/ACT inhaler Inhale 1-2 puffs into the lungs every 4 (four) hours as needed for wheezing or shortness of breath. 18 Inhaler 1   aspirin EC 81 MG tablet Take 81  mg by mouth daily.     buPROPion (WELLBUTRIN XL) 300 MG 24 hr tablet Take 1 tablet (300 mg total) by mouth daily. 90 tablet 3   modafinil (PROVIGIL) 200 MG tablet Take 1 tablet (200 mg total) by mouth daily. 90 tablet 1   Multiple Vitamins-Minerals (WOMENS MULTIVITAMIN PO) Take 1 tablet by mouth daily.     rosuvastatin (CRESTOR) 5 MG tablet Take 1 tablet (5 mg total) by mouth daily. 90 tablet 3   Current Facility-Administered Medications on File Prior to Visit  Medication Dose Route Frequency Provider Last Rate Last Admin   ibuprofen (ADVIL,MOTRIN) tablet 600 mg  600 mg Oral Once Keyvin Rison, Gwenlyn Found, MD        Review of Systems:  As per HPI- otherwise negative.   Physical Examination: There  were no vitals filed for this visit. There were no vitals filed for this visit. There is no height or weight on file to calculate BMI. Ideal Body Weight:    GEN: no acute distress. HEENT: Atraumatic, Normocephalic.  Ears and Nose: No external deformity. CV: RRR, No M/G/R. No JVD. No thrill. No extra heart sounds. PULM: CTA B, no wheezes, crackles, rhonchi. No retractions. No resp. distress. No accessory muscle use. ABD: S, NT, ND, +BS. No rebound. No HSM. EXTR: No c/c/e PSYCH: Normally interactive. Conversant.    Assessment and Plan: ***  Signed Abbe Amsterdam, MD

## 2023-03-06 ENCOUNTER — Ambulatory Visit: Payer: 59 | Admitting: Family Medicine

## 2023-03-06 ENCOUNTER — Telehealth (INDEPENDENT_AMBULATORY_CARE_PROVIDER_SITE_OTHER): Payer: 59 | Admitting: Family Medicine

## 2023-03-06 ENCOUNTER — Encounter: Payer: Self-pay | Admitting: Family Medicine

## 2023-03-06 VITALS — BP 112/74 | HR 85

## 2023-03-06 DIAGNOSIS — I7774 Dissection of vertebral artery: Secondary | ICD-10-CM

## 2023-03-06 DIAGNOSIS — G4485 Primary stabbing headache: Secondary | ICD-10-CM

## 2023-03-06 NOTE — Progress Notes (Signed)
[facility] [address], [address] [place], [place] [address] [phone]  Date:  03/06/2023   Name:  Melissa Owens   DOB:  04/05/1981   MRN:  [id number]  PCP:  Pearline Cables, MD    Chief Complaint: No chief complaint on file.   History of Present Illness:  Melissa Owens is a 42 y.o. very pleasant female patient who presents with the following:  Connected with pat via mychart video today for a virtual visit Patient location is home, location is office.  Patient identity from with 2 factors.  She gives consent for virtual visit today.  Patient and myself are present on the visit today  She notes very short, sharp HA for the last 3 weeks or so-will last less than about 5 seconds Feels "like an ice pick"  Goes away as fast as it came Have continued to happen persistently- might be worse with stress but no other pattern that she can ascertain  A week ago she has 12 in one day!  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   resolved    Past Surgical History:  Procedure Laterality Date   CESAREAN SECTION  2009   IR ANGIO INTRA EXTRACRAN SEL COM CAROTID INNOMINATE BILAT MOD SED  08/30/2018   IR ANGIO VERTEBRAL SEL VERTEBRAL BILAT MOD SED  08/30/2018   WISDOM TOOTH EXTRACTION      Social History   Tobacco Use   Smoking status: Never   Smokeless tobacco: Never  Substance Use Topics   Alcohol use: Yes    Alcohol/week: 1.0 - 2.0 standard drink of alcohol    Types: 1 - 2 Glasses of wine per week   Drug use: Never    Family History  Problem Relation Age of Onset   Hypertension Mother    High Cholesterol Mother    Cancer Father    Heart attack Father    Heart disease Father    Arthritis Maternal Grandmother    Cancer Maternal Grandmother    High Cholesterol Maternal Grandmother    High blood pressure Maternal Grandmother    Stroke Maternal Grandmother    Arthritis Maternal Grandfather    Diabetes Maternal Grandfather    Hearing loss Maternal Grandfather    High Cholesterol Maternal Grandfather    High  blood pressure Maternal Grandfather    Asthma Paternal Grandmother    Diabetes Paternal Grandmother    Miscarriages / Stillbirths Paternal Grandmother    COPD Paternal Grandfather    Alcohol abuse Brother    Drug abuse Brother     Allergies  Allergen Reactions   Sulfa Antibiotics Hives    Medication list has been reviewed and updated.  Current Outpatient Medications on File Prior to Visit  Medication Sig Dispense Refill   albuterol (VENTOLIN HFA) 108 (90 Base) MCG/ACT inhaler Inhale 1-2 puffs into the lungs every 4 (four) hours as needed for wheezing or shortness of breath. 18 Inhaler 1   aspirin EC 81 MG tablet Take 81 mg by mouth daily.     buPROPion (WELLBUTRIN XL) 300 MG 24 hr tablet Take 1 tablet (300 mg total) by mouth daily. 90 tablet 3   modafinil  (PROVIGIL) 200 MG tablet Take 1 tablet (200 mg total) by mouth daily. 90 tablet 1   Multiple Vitamins-Minerals (WOMENS MULTIVITAMIN PO) Take 1 tablet by mouth daily.     rosuvastatin (CRESTOR) 5 MG tablet Take 1 tablet (5 mg total) by mouth daily. 90 tablet 3   Current Facility-Administered Medications on File Prior to Visit  Medication Dose Route Frequency Provider Last Rate Last Admin   ibuprofen (ADVIL,MOTRIN) tablet 600 mg  600 mg Oral Once Ian Castagna, Gwenlyn Found, MD        Review of Systems:  As per HPI- otherwise negative.   Physical Examination: Vitals:   03/06/23 1414  BP: 112/74  Pulse: 85   There were no vitals filed for this visit. There is no height or weight on file to calculate BMI. Ideal Body Weight:    Patient observed via video monitor.  She looks well, her normal self  Assessment and Plan: Vertebral artery dissection (HCC) - Plan: CT ANGIO HEAD W OR WO CONTRAST, CT ANGIO NECK W OR WO CONTRAST  Primary stabbing headache - Plan: CT ANGIO HEAD W OR WO CONTRAST, CT ANGIO NECK W OR WO CONTRAST  Patient with history of vertebral artery dissection in 2019, seen today virtually with concern of unusual headache pattern.  I discussed her case with neuroradiology who recommended obtaining a CT angiogram of her head and neck.  I have ordered this for her stat.  Advised patient to please seek care right away should anything change or get worse and she states agreement  Signed Abbe Amsterdam, MD

## 2023-03-07 ENCOUNTER — Encounter: Payer: Self-pay | Admitting: Family Medicine

## 2023-03-07 ENCOUNTER — Other Ambulatory Visit: Payer: Self-pay | Admitting: Family Medicine

## 2023-03-07 ENCOUNTER — Ambulatory Visit
Admission: RE | Admit: 2023-03-07 | Discharge: 2023-03-07 | Disposition: A | Discharge status: Home / Self Care | Payer: 59 | Source: Ambulatory Visit | Attending: Family Medicine | Admitting: Family Medicine

## 2023-03-07 DIAGNOSIS — I7774 Dissection of vertebral artery: Secondary | ICD-10-CM

## 2023-03-07 DIAGNOSIS — R519 Headache, unspecified: Secondary | ICD-10-CM | POA: Diagnosis not present

## 2023-03-07 DIAGNOSIS — G4485 Primary stabbing headache: Secondary | ICD-10-CM

## 2023-03-07 DIAGNOSIS — I6501 Occlusion and stenosis of right vertebral artery: Secondary | ICD-10-CM | POA: Diagnosis not present

## 2023-03-07 MED ORDER — IOPAMIDOL (ISOVUE-370) INJECTION 76%
75.0000 mL | Freq: Once | INTRAVENOUS | Status: AC | PRN
  Administered 2023-03-07: 75 mL via INTRAVENOUS

## 2023-03-13 ENCOUNTER — Ambulatory Visit: Payer: 59 | Admitting: Family Medicine

## 2023-04-05 ENCOUNTER — Other Ambulatory Visit: Payer: Self-pay

## 2023-05-24 ENCOUNTER — Other Ambulatory Visit (HOSPITAL_COMMUNITY): Payer: Self-pay

## 2023-05-24 ENCOUNTER — Other Ambulatory Visit: Payer: Self-pay | Admitting: Family Medicine

## 2023-05-24 NOTE — Telephone Encounter (Signed)
Last seen on 08/09/22 Follow up scheduled on 08/15/23 Last filled on 01/24/23 #90 tablets (90 day supply) Rx pending to be signed

## 2023-05-25 ENCOUNTER — Other Ambulatory Visit: Payer: Self-pay

## 2023-05-26 ENCOUNTER — Other Ambulatory Visit: Payer: Self-pay | Admitting: Family Medicine

## 2023-05-26 ENCOUNTER — Other Ambulatory Visit: Payer: Self-pay

## 2023-05-26 ENCOUNTER — Other Ambulatory Visit (HOSPITAL_COMMUNITY): Payer: Self-pay

## 2023-05-29 ENCOUNTER — Other Ambulatory Visit: Payer: Self-pay

## 2023-05-29 ENCOUNTER — Telehealth: Payer: Self-pay | Admitting: Family Medicine

## 2023-05-29 ENCOUNTER — Other Ambulatory Visit (HOSPITAL_COMMUNITY): Payer: Self-pay

## 2023-05-29 MED ORDER — MODAFINIL 200 MG PO TABS
200.0000 mg | ORAL_TABLET | Freq: Every day | ORAL | 1 refills | Status: DC
  Filled 2023-05-29: qty 90, 90d supply, fill #0

## 2023-05-29 NOTE — Telephone Encounter (Signed)
Last seen on 08/09/22 Follow up scheduled on 08/15/23 Last filled on 01/24/23 #90 tablets (90 day suppyly) Rx pending to be signed

## 2023-05-29 NOTE — Telephone Encounter (Signed)
Pt is asking for a call on the status of her modafinil (PROVIGIL) 200 MG tablet  Being filled to [facility]

## 2023-05-29 NOTE — Telephone Encounter (Signed)
Rx request from pharmacy has been sent to Amy

## 2023-06-06 ENCOUNTER — Other Ambulatory Visit: Payer: Self-pay

## 2023-07-11 ENCOUNTER — Other Ambulatory Visit (HOSPITAL_BASED_OUTPATIENT_CLINIC_OR_DEPARTMENT_OTHER): Payer: Self-pay

## 2023-07-11 ENCOUNTER — Other Ambulatory Visit: Payer: Self-pay

## 2023-07-11 MED ORDER — INFLUENZA VIRUS VACC SPLIT PF (FLUZONE) 0.5 ML IM SUSY
0.5000 mL | PREFILLED_SYRINGE | Freq: Once | INTRAMUSCULAR | 0 refills | Status: AC
  Filled 2023-07-11: qty 0.5, 1d supply, fill #0

## 2023-08-09 NOTE — Progress Notes (Deleted)
   PATIENT: Melissa Owens DOB: Jul 11, 1981  REASON FOR VISIT: follow up HISTORY FROM: patient  Virtual Visit via Telephone Note  I connected with Melissa Owens on 08/09/23 at  8:45 AM EST by telephone and verified that I am speaking with the correct person using two identifiers.   I discussed the limitations, risks, security and privacy concerns of performing an evaluation and management service by telephone and the availability of in person appointments. I also discussed with the patient that there may be a patient responsible charge related to this service. The patient expressed understanding and agreed to proceed.   History of Present Illness:  08/09/23 ALL (Mychart): Melissa Owens returns for follow up for idopathic hypersomnolence. She continues modafinil 200mg  daily.   08/09/2022 ALL (Mychart):  Melissa Owens returns for follow up for idopathic hypersomnolence. She continues modafinil 200mg  daily. She is tolerating med without obvious adverse effects. She feels hypersomnia is well controlled. She is working. She exercises regularly. She is feeling well and without concerns.   08/03/2021 ALL (Mychart): Melissa Owens is a 42 y.o. female here today for follow up for idiopathic hypersomnolence. She continues modafinil 200mg  daily. She continues to note benefit. No significant changes. She recently restarted Wellbutrin and is tolerating well. She is feeling well and denies concerns, today.   07/07/20 ALL:  Melissa Owens is a 42 y.o. female here today for follow up for idiopathic hypersomnolence.  Narcolepsy gene positive, hoever, MSLT showed "This multiple sleep latency test reveals a mean sleep latency of 15.8 minutes with 3 out of 5 nap opportunities during which no sleep was recorded. 2. There were no sleep periods during which REM sleep was recorded". She has continued modafinil 200mg . She feels that this definitely helps. Occasionally she will take 1/2 tablet if she isn't busy. She denies adverse effects.  She is feeling well today. She had labs with PCP this morning.   Observations/Objective:  Generalized: Well developed, in no acute distress  Mentation: Alert oriented to time, place, history taking. Follows all commands speech and language fluent   Assessment and Plan:  42 y.o. year old female  has a past medical history of Chicken pox, Exercise-induced asthma (11/21/2017), High cholesterol, Migraines, Urinary tract infection, and Vertebral artery dissection (HCC) (08/2018). here with  No diagnosis found.  Melissa Owens continues to do well on modafinil. PDMP shows appropriate refills, last refilled 07/02/2021 for 90 days. She will call when she needs a refill. Healthy lifestyle habits advised. She will follow up in 1 year, sooner if needed.   No orders of the defined types were placed in this encounter.    No orders of the defined types were placed in this encounter.     Follow Up Instructions:  I discussed the assessment and treatment plan with the patient. The patient was provided an opportunity to ask questions and all were answered. The patient agreed with the plan and demonstrated an understanding of the instructions.   The patient was advised to call back or seek an in-person evaluation if the symptoms worsen or if the condition fails to improve as anticipated.  I provided 15 minutes of non-face-to-face time during this encounter. Patient located at their place of residence during Mychart visit. Provider is in the office.    Shawnie Dapper, NP

## 2023-08-15 ENCOUNTER — Telehealth: Payer: Self-pay | Admitting: Family Medicine

## 2023-09-11 ENCOUNTER — Other Ambulatory Visit: Payer: Self-pay

## 2023-09-17 NOTE — Patient Instructions (Signed)
Below is our plan:  We will continue modafinil 200mg daily   Please make sure you are staying well hydrated. I recommend 50-60 ounces daily. Well balanced diet and regular exercise encouraged. Consistent sleep schedule with 6-8 hours recommended.   Please continue follow up with care team as directed.   Follow up with me in 1 year   You may receive a survey regarding today's visit. I encourage you to leave honest feed back as I do use this information to improve patient care. Thank you for seeing me today!    

## 2023-09-17 NOTE — Progress Notes (Signed)
 PATIENT: Melissa Owens DOB: 01-27-1981  REASON FOR VISIT: follow up HISTORY FROM: patient  Virtual Visit via Telephone Note  I connected with Melissa Owens on 09/17/23 at  8:15 AM EST by telephone and verified that I am speaking with the correct person using two identifiers.   I discussed the limitations, risks, security and privacy concerns of performing an evaluation and management service by telephone and the availability of in person appointments. I also discussed with the patient that there may be a patient responsible charge related to this service. The patient expressed understanding and agreed to proceed.   History of Present Illness:  09/17/23 ALL (Mychart): Melissa Owens returns for follow up for idiopathic hypersomnolence. She continues modafinil  200mg  daily. Last filled 05/29/2023 for 90 tablets. She feels she is doing well. 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 May nap is needed. She exercises 5 days a week. She is feeling well and without concerns.   08/09/2022 ALL (MYchart): Melissa Owens returns for follow up for idopathic hypersomnolence. She continues modafinil  200mg  daily. She is tolerating med without obvious adverse effects. She feels hypersomnia is well controlled. She is working. She exercises regularly. She is feeling well and without concerns.   08/03/2021 ALL (Mychart): Melissa Owens is a 42 y.o. female here today for follow up for idiopathic hypersomnolence. She continues modafinil  200mg  daily. She continues to note benefit. No significant changes. She recently restarted Wellbutrin  and is tolerating well. She is feeling well and denies concerns, today.   07/07/20 ALL:  Melissa Owens is a 42 y.o. female here today for follow up for idiopathic hypersomnolence.  Narcolepsy gene positive, hoever, MSLT showed This multiple sleep latency test reveals a mean sleep latency of 15.8 minutes with 3 out of 5 nap opportunities during which no sleep was recorded. 2. There were no sleep  periods during which REM sleep was recorded. She has continued modafinil  200mg . She feels that this definitely helps. Occasionally she will take 1/2 tablet if she isn't busy. She denies adverse effects. She is feeling well today. She had labs with PCP this morning.   Observations/Objective:  Generalized: Well developed, in no acute distress  Mentation: Alert oriented to time, place, history taking. Follows all commands speech and language fluent   Assessment and Plan:  42 y.o. year old female  has a past medical history of Chicken pox, Exercise-induced asthma (11/21/2017), High cholesterol, Migraines, Urinary tract infection, and Vertebral artery dissection (HCC) (08/2018). here with  No diagnosis found.   Garlene continues to do well on modafinil . PDMP shows appropriate refills, last refilled 07/02/2021 for 90 days. ESS stable. She will call when she needs a refill. Healthy lifestyle habits advised. She will follow up in 1 year, sooner if needed.    No orders of the defined types were placed in this encounter.    No orders of the defined types were placed in this encounter.     Follow Up Instructions:  I discussed the assessment and treatment plan with the patient. The patient was provided an opportunity to ask questions and all were answered. The patient agreed with the plan and demonstrated an understanding of the instructions.   The patient was advised to call back or seek an in-person evaluation if the symptoms worsen or if the condition fails to improve as anticipated.  I provided 15 minutes of non-face-to-face time during this encounter. Patient located at their place of residence during Mychart visit. Provider is in the  office.    Laurin Morgenstern, NP

## 2023-09-18 ENCOUNTER — Encounter: Payer: Self-pay | Admitting: *Deleted

## 2023-09-18 DIAGNOSIS — Z01411 Encounter for gynecological examination (general) (routine) with abnormal findings: Secondary | ICD-10-CM | POA: Diagnosis not present

## 2023-09-18 DIAGNOSIS — Z1231 Encounter for screening mammogram for malignant neoplasm of breast: Secondary | ICD-10-CM | POA: Diagnosis not present

## 2023-09-18 DIAGNOSIS — Z01419 Encounter for gynecological examination (general) (routine) without abnormal findings: Secondary | ICD-10-CM | POA: Diagnosis not present

## 2023-09-18 DIAGNOSIS — Z1331 Encounter for screening for depression: Secondary | ICD-10-CM | POA: Diagnosis not present

## 2023-09-18 DIAGNOSIS — Z0142 Encounter for cervical smear to confirm findings of recent normal smear following initial abnormal smear: Secondary | ICD-10-CM | POA: Diagnosis not present

## 2023-09-18 DIAGNOSIS — Z113 Encounter for screening for infections with a predominantly sexual mode of transmission: Secondary | ICD-10-CM | POA: Diagnosis not present

## 2023-09-18 DIAGNOSIS — Z124 Encounter for screening for malignant neoplasm of cervix: Secondary | ICD-10-CM | POA: Diagnosis not present

## 2023-09-18 LAB — RESULTS CONSOLE HPV: CHL HPV: NEGATIVE

## 2023-09-18 LAB — HM PAP SMEAR
HM Pap smear: NEGATIVE
HPV, high-risk: NEGATIVE

## 2023-09-18 LAB — HM MAMMOGRAPHY

## 2023-09-19 ENCOUNTER — Other Ambulatory Visit (HOSPITAL_BASED_OUTPATIENT_CLINIC_OR_DEPARTMENT_OTHER): Payer: Self-pay

## 2023-09-19 ENCOUNTER — Telehealth: Payer: Self-pay | Admitting: Family Medicine

## 2023-09-19 ENCOUNTER — Encounter: Payer: Self-pay | Admitting: Family Medicine

## 2023-09-19 DIAGNOSIS — G4711 Idiopathic hypersomnia with long sleep time: Secondary | ICD-10-CM | POA: Diagnosis not present

## 2023-09-19 MED ORDER — MODAFINIL 200 MG PO TABS
200.0000 mg | ORAL_TABLET | Freq: Every day | ORAL | 1 refills | Status: DC
  Filled 2023-09-19: qty 90, 90d supply, fill #0
  Filled 2024-01-17: qty 90, 90d supply, fill #1

## 2023-09-25 ENCOUNTER — Other Ambulatory Visit: Payer: Self-pay

## 2023-10-16 ENCOUNTER — Other Ambulatory Visit: Payer: Self-pay

## 2023-12-10 NOTE — Progress Notes (Unsigned)
 [facility] [address] [place], [place] [address] [phone]  Date:  12/11/2023   Name:  VERDA MEHTA   DOB:  [birth year]   MRN:  [id number]  PCP:  Pearline Cables, MD    Chief Complaint: No chief complaint on file.   History of Present Illness:  FLO BERROA is a 43 y.o. very pleasant female patient who presents with the following:  Huxley is seen today for physical exam.  Most recent visit with myself was a virtual visit in June  History of vertebral artery dissection which occurred in 2019, since then she has taken a low-dose daily aspirin. She also has history of hypersomnolence/narcolepsy, Exercise-induced asthma, seasonal affective disorder, vitamin D deficiency  Saw her neurology provider at the end of December-taking modafinil 200 mg daily and doing well  She followed up with gynecology also in December, Dr. 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   Seasonal affective disorder (HCC) 11/21/2017   Familial hyperlipidemia 11/21/2017   Exercise-induced asthma 11/21/2017    Past Medical History:  Diagnosis Date   Chicken pox    Exercise-induced asthma 11/21/2017   High cholesterol    Migraines    Urinary tract infection    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   resolved    Past Surgical History:  Procedure Laterality Date   CESAREAN SECTION  2009   IR ANGIO INTRA EXTRACRAN SEL COM CAROTID INNOMINATE BILAT MOD SED  08/30/2018   IR ANGIO VERTEBRAL SEL VERTEBRAL BILAT MOD SED  08/30/2018   WISDOM TOOTH EXTRACTION      Social History   Tobacco Use    Smoking status: Never   Smokeless tobacco: Never  Substance Use Topics   Alcohol use: Yes    Alcohol/week: 1.0 - 2.0 standard drink of alcohol    Types: 1 - 2 Glasses of wine per week   Drug use: Never    Family History  Problem Relation Age of Onset   Hypertension Mother    High Cholesterol Mother    Cancer Father    Heart attack Father    Heart disease Father    Arthritis Maternal Grandmother    Cancer Maternal Grandmother    High Cholesterol Maternal Grandmother    High blood pressure Maternal Grandmother    Stroke Maternal Grandmother    Arthritis Maternal Grandfather    Diabetes Maternal Grandfather    Hearing loss Maternal Grandfather    High Cholesterol Maternal Grandfather    High blood pressure Maternal Grandfather    Asthma Paternal Grandmother    Diabetes Paternal Grandmother    Miscarriages / Stillbirths Paternal Grandmother    COPD Paternal Grandfather    Alcohol abuse Brother    Drug abuse Brother     Allergies  Allergen Reactions   Sulfa Antibiotics Hives    Medication list has been reviewed and updated.  Current Outpatient Medications on File Prior to Visit  Medication Sig Dispense Refill   albuterol (VENTOLIN HFA) 108 (90 Base) MCG/ACT inhaler Inhale 1-2 puffs into the lungs every 4 (four) hours as needed for wheezing or shortness of breath.  18 Inhaler 1   aspirin EC 81 MG tablet Take 81 mg by mouth daily.     buPROPion (WELLBUTRIN XL) 300 MG 24 hr tablet Take 1 tablet (300 mg total) by mouth daily. 90 tablet 3   modafinil (PROVIGIL) 200 MG tablet Take 1 tablet (200 mg total) by mouth daily. 90 tablet 1   Multiple Vitamins-Minerals (WOMENS MULTIVITAMIN PO) Take 1 tablet by mouth daily.     rosuvastatin (CRESTOR) 5 MG tablet Take 1 tablet (5 mg total) by mouth daily. 90 tablet 3   Current Facility-Administered Medications on File Prior to Visit  Medication Dose Route Frequency Provider Last Rate Last Admin   ibuprofen (ADVIL,MOTRIN) tablet 600 mg   600 mg Oral Once Nachelle Negrette, Gwenlyn Found, MD        Review of Systems:  As per HPI- otherwise negative.   Physical Examination: There were no vitals filed for this visit. There were no vitals filed for this visit. There is no height or weight on file to calculate BMI. Ideal Body Weight:    GEN: no acute distress. HEENT: Atraumatic, Normocephalic.  Ears and Nose: No external deformity. CV: RRR, No M/G/R. No JVD. No thrill. No extra heart sounds. PULM: CTA B, no wheezes, crackles, rhonchi. No retractions. No resp. distress. No accessory muscle use. ABD: S, NT, ND, +BS. No rebound. No HSM. EXTR: No c/c/e PSYCH: Normally interactive. Conversant.    Assessment and Plan: *** Physical exam today.  Encouraged healthy diet and exercise routine. Will plan further follow- up pending labs.  Signed Abbe Amsterdam, MD

## 2023-12-10 NOTE — Patient Instructions (Signed)
 It was great to see you again today, I will be in touch with your lab work

## 2023-12-11 ENCOUNTER — Encounter: Payer: Self-pay | Admitting: Family Medicine

## 2023-12-11 ENCOUNTER — Other Ambulatory Visit: Payer: Self-pay | Admitting: Family Medicine

## 2023-12-11 ENCOUNTER — Ambulatory Visit (INDEPENDENT_AMBULATORY_CARE_PROVIDER_SITE_OTHER): Payer: 59 | Admitting: Family Medicine

## 2023-12-11 ENCOUNTER — Other Ambulatory Visit (HOSPITAL_BASED_OUTPATIENT_CLINIC_OR_DEPARTMENT_OTHER): Payer: Self-pay

## 2023-12-11 ENCOUNTER — Other Ambulatory Visit: Payer: Self-pay

## 2023-12-11 VITALS — BP 124/78 | HR 67 | Temp 97.8°F | Ht 63.0 in | Wt 140.0 lb

## 2023-12-11 DIAGNOSIS — J45990 Exercise induced bronchospasm: Secondary | ICD-10-CM

## 2023-12-11 DIAGNOSIS — D649 Anemia, unspecified: Secondary | ICD-10-CM

## 2023-12-11 DIAGNOSIS — E559 Vitamin D deficiency, unspecified: Secondary | ICD-10-CM

## 2023-12-11 DIAGNOSIS — E7849 Other hyperlipidemia: Secondary | ICD-10-CM

## 2023-12-11 DIAGNOSIS — Z0000 Encounter for general adult medical examination without abnormal findings: Secondary | ICD-10-CM

## 2023-12-11 DIAGNOSIS — Z131 Encounter for screening for diabetes mellitus: Secondary | ICD-10-CM

## 2023-12-11 DIAGNOSIS — F338 Other recurrent depressive disorders: Secondary | ICD-10-CM

## 2023-12-11 DIAGNOSIS — I7774 Dissection of vertebral artery: Secondary | ICD-10-CM

## 2023-12-11 DIAGNOSIS — D75839 Thrombocytosis, unspecified: Secondary | ICD-10-CM

## 2023-12-11 DIAGNOSIS — Z1329 Encounter for screening for other suspected endocrine disorder: Secondary | ICD-10-CM

## 2023-12-11 LAB — LIPID PANEL
Cholesterol: 181 mg/dL (ref 0–200)
HDL: 59.8 mg/dL (ref >39.00)
LDL Cholesterol: 96 mg/dL (ref 0–99)
NonHDL: 120.78
Total CHOL/HDL Ratio: 3
Triglycerides: 124 mg/dL (ref 0.0–149.0)
VLDL: 24.8 mg/dL (ref 0.0–40.0)

## 2023-12-11 LAB — COMPREHENSIVE METABOLIC PANEL
ALT: 27 U/L (ref 0–35)
AST: 25 U/L (ref 0–37)
Albumin: 4.5 g/dL (ref 3.5–5.2)
Alkaline Phosphatase: 60 U/L (ref 39–117)
BUN: 12 mg/dL (ref 6–23)
CO2: 29 meq/L (ref 19–32)
Calcium: 10.1 mg/dL (ref 8.4–10.5)
Chloride: 101 meq/L (ref 96–112)
Creatinine, Ser: 0.87 mg/dL (ref 0.40–1.20)
GFR: 81.8 mL/min (ref >60.00)
Glucose, Bld: 93 mg/dL (ref 70–99)
Potassium: 4.4 meq/L (ref 3.5–5.1)
Sodium: 137 meq/L (ref 135–145)
Total Bilirubin: 0.4 mg/dL (ref 0.2–1.2)
Total Protein: 7.1 g/dL (ref 6.0–8.3)

## 2023-12-11 LAB — CBC
HCT: 38.2 % (ref 36.0–46.0)
Hemoglobin: 12.5 g/dL (ref 12.0–15.0)
MCHC: 32.7 g/dL (ref 30.0–36.0)
MCV: 90.1 fl (ref 78.0–100.0)
Platelets: 458 10*3/uL — ABNORMAL HIGH (ref 150.0–400.0)
RBC: 4.24 Mil/uL (ref 3.87–5.11)
RDW: 14.9 % (ref 11.5–15.5)
WBC: 5.7 10*3/uL (ref 4.0–10.5)

## 2023-12-11 LAB — HEMOGLOBIN A1C: Hgb A1c MFr Bld: 5.1 % (ref 4.6–6.5)

## 2023-12-11 LAB — VITAMIN D 25 HYDROXY (VIT D DEFICIENCY, FRACTURES): VITD: 45.39 ng/mL (ref 30.00–100.00)

## 2023-12-11 LAB — TSH: TSH: 1.89 u[IU]/mL (ref 0.35–5.50)

## 2023-12-11 MED ORDER — ROSUVASTATIN CALCIUM 5 MG PO TABS
5.0000 mg | ORAL_TABLET | Freq: Every day | ORAL | 3 refills | Status: AC
  Filled 2023-12-11: qty 90, 90d supply, fill #0
  Filled 2024-03-18: qty 90, 90d supply, fill #1
  Filled 2024-06-17: qty 90, 90d supply, fill #2
  Filled 2024-09-23: qty 90, 90d supply, fill #3

## 2023-12-11 MED ORDER — BUPROPION HCL ER (XL) 300 MG PO TB24
300.0000 mg | ORAL_TABLET | Freq: Every day | ORAL | 3 refills | Status: AC
  Filled 2023-12-11 – 2024-01-17 (×2): qty 90, 90d supply, fill #0
  Filled 2024-04-22: qty 90, 90d supply, fill #1
  Filled 2024-07-24: qty 90, 90d supply, fill #2

## 2023-12-11 MED ORDER — ALBUTEROL SULFATE HFA 108 (90 BASE) MCG/ACT IN AERS
1.0000 | INHALATION_SPRAY | RESPIRATORY_TRACT | 1 refills | Status: AC | PRN
  Filled 2023-12-11: qty 6.7, 25d supply, fill #0

## 2023-12-11 NOTE — Addendum Note (Signed)
 Addended by: Abbe Amsterdam C on: 12/11/2023 05:16 PM   Modules accepted: Orders

## 2023-12-13 ENCOUNTER — Other Ambulatory Visit: Payer: Self-pay

## 2024-01-17 ENCOUNTER — Other Ambulatory Visit: Payer: Self-pay

## 2024-01-18 DIAGNOSIS — D2271 Melanocytic nevi of right lower limb, including hip: Secondary | ICD-10-CM | POA: Diagnosis not present

## 2024-01-18 DIAGNOSIS — Q825 Congenital non-neoplastic nevus: Secondary | ICD-10-CM | POA: Diagnosis not present

## 2024-01-18 DIAGNOSIS — D485 Neoplasm of uncertain behavior of skin: Secondary | ICD-10-CM | POA: Diagnosis not present

## 2024-01-18 DIAGNOSIS — L578 Other skin changes due to chronic exposure to nonionizing radiation: Secondary | ICD-10-CM | POA: Diagnosis not present

## 2024-01-18 DIAGNOSIS — Z86018 Personal history of other benign neoplasm: Secondary | ICD-10-CM | POA: Diagnosis not present

## 2024-01-18 DIAGNOSIS — L814 Other melanin hyperpigmentation: Secondary | ICD-10-CM | POA: Diagnosis not present

## 2024-01-18 DIAGNOSIS — D225 Melanocytic nevi of trunk: Secondary | ICD-10-CM | POA: Diagnosis not present

## 2024-03-18 ENCOUNTER — Other Ambulatory Visit: Payer: Self-pay

## 2024-03-21 ENCOUNTER — Other Ambulatory Visit: Payer: Self-pay

## 2024-03-21 ENCOUNTER — Telehealth: Admitting: Physician Assistant

## 2024-03-21 ENCOUNTER — Other Ambulatory Visit (HOSPITAL_BASED_OUTPATIENT_CLINIC_OR_DEPARTMENT_OTHER): Payer: Self-pay

## 2024-03-21 DIAGNOSIS — R3989 Other symptoms and signs involving the genitourinary system: Secondary | ICD-10-CM

## 2024-03-21 MED ORDER — NITROFURANTOIN MONOHYD MACRO 100 MG PO CAPS
100.0000 mg | ORAL_CAPSULE | Freq: Two times a day (BID) | ORAL | 0 refills | Status: DC
  Filled 2024-03-21 (×2): qty 10, 5d supply, fill #0

## 2024-03-21 NOTE — Progress Notes (Signed)

## 2024-04-03 DIAGNOSIS — H35413 Lattice degeneration of retina, bilateral: Secondary | ICD-10-CM | POA: Diagnosis not present

## 2024-04-03 DIAGNOSIS — H43812 Vitreous degeneration, left eye: Secondary | ICD-10-CM | POA: Diagnosis not present

## 2024-04-22 ENCOUNTER — Other Ambulatory Visit (HOSPITAL_COMMUNITY): Payer: Self-pay

## 2024-04-22 ENCOUNTER — Other Ambulatory Visit: Payer: Self-pay | Admitting: Family Medicine

## 2024-04-22 ENCOUNTER — Other Ambulatory Visit: Payer: Self-pay

## 2024-04-22 MED ORDER — MODAFINIL 200 MG PO TABS
200.0000 mg | ORAL_TABLET | Freq: Every day | ORAL | 1 refills | Status: AC
  Filled 2024-04-22: qty 90, 90d supply, fill #0
  Filled 2024-07-23: qty 90, 90d supply, fill #1

## 2024-04-22 NOTE — Telephone Encounter (Signed)
 You are work in provider this afternoon  Last seen on 09/19/23 per note We will continue modafinil  200mg  daily.  Follow up scheduled on 09/23/24   Dispensed Days Supply Quantity Provider Pharmacy  modafinil  (PROVIGIL ) 200 MG tablet 01/17/2024 90 90 tablet Lomax, Amy, NP [facility]   Rx pending to be signed

## 2024-04-23 ENCOUNTER — Other Ambulatory Visit: Payer: Self-pay

## 2024-05-21 ENCOUNTER — Encounter: Payer: Self-pay | Admitting: Sports Medicine

## 2024-06-17 ENCOUNTER — Other Ambulatory Visit: Payer: Self-pay

## 2024-06-19 ENCOUNTER — Telehealth: Payer: Self-pay

## 2024-06-19 ENCOUNTER — Telehealth: Admitting: Family Medicine

## 2024-06-19 ENCOUNTER — Other Ambulatory Visit (HOSPITAL_BASED_OUTPATIENT_CLINIC_OR_DEPARTMENT_OTHER): Payer: Self-pay

## 2024-06-19 ENCOUNTER — Other Ambulatory Visit: Payer: Self-pay

## 2024-06-19 DIAGNOSIS — R3989 Other symptoms and signs involving the genitourinary system: Secondary | ICD-10-CM | POA: Diagnosis not present

## 2024-06-19 MED ORDER — CEPHALEXIN 500 MG PO CAPS
500.0000 mg | ORAL_CAPSULE | Freq: Two times a day (BID) | ORAL | 0 refills | Status: AC
  Filled 2024-06-19 (×3): qty 14, 7d supply, fill #0

## 2024-06-19 NOTE — Progress Notes (Signed)

## 2024-06-19 NOTE — Telephone Encounter (Signed)
 Copied from CRM #[id number]. Topic: Clinical - Medical Advice >> Jun 19, 2024 12:11 PM Melissa Owens wrote: Reason for CRM: Patient called in regarding lab appointment, patient stated she currently has a UTI and wasn't sure if that would effect her labs would like a callback from the nurse on if she should keep the appointment for tomorrow or reschedule it

## 2024-06-20 ENCOUNTER — Other Ambulatory Visit (INDEPENDENT_AMBULATORY_CARE_PROVIDER_SITE_OTHER)

## 2024-06-20 DIAGNOSIS — D75839 Thrombocytosis, unspecified: Secondary | ICD-10-CM | POA: Diagnosis not present

## 2024-06-20 LAB — CBC
HCT: 37.6 % (ref 36.0–46.0)
Hemoglobin: 12.3 g/dL (ref 12.0–15.0)
MCHC: 32.7 g/dL (ref 30.0–36.0)
MCV: 90 fl (ref 78.0–100.0)
Platelets: 449 K/uL — ABNORMAL HIGH (ref 150.0–400.0)
RBC: 4.18 Mil/uL (ref 3.87–5.11)
RDW: 14.2 % (ref 11.5–15.5)
WBC: 9.6 K/uL (ref 4.0–10.5)

## 2024-06-21 ENCOUNTER — Encounter: Payer: Self-pay | Admitting: Family Medicine

## 2024-06-28 ENCOUNTER — Other Ambulatory Visit (HOSPITAL_BASED_OUTPATIENT_CLINIC_OR_DEPARTMENT_OTHER): Payer: Self-pay

## 2024-06-29 ENCOUNTER — Other Ambulatory Visit (HOSPITAL_BASED_OUTPATIENT_CLINIC_OR_DEPARTMENT_OTHER): Payer: Self-pay

## 2024-06-29 MED ORDER — FLUZONE 0.5 ML IM SUSY
0.5000 mL | PREFILLED_SYRINGE | Freq: Once | INTRAMUSCULAR | 0 refills | Status: AC
  Filled 2024-06-29: qty 0.5, 1d supply, fill #0

## 2024-06-30 ENCOUNTER — Other Ambulatory Visit (HOSPITAL_BASED_OUTPATIENT_CLINIC_OR_DEPARTMENT_OTHER): Payer: Self-pay

## 2024-07-23 ENCOUNTER — Other Ambulatory Visit (HOSPITAL_COMMUNITY): Payer: Self-pay

## 2024-07-24 ENCOUNTER — Other Ambulatory Visit: Payer: Self-pay

## 2024-09-17 NOTE — Patient Instructions (Signed)
Below is our plan:  We will continue modafinil 200mg daily   Please make sure you are staying well hydrated. I recommend 50-60 ounces daily. Well balanced diet and regular exercise encouraged. Consistent sleep schedule with 6-8 hours recommended.   Please continue follow up with care team as directed.   Follow up with me in 1 year   You may receive a survey regarding today's visit. I encourage you to leave honest feed back as I do use this information to improve patient care. Thank you for seeing me today!    

## 2024-09-17 NOTE — Progress Notes (Signed)
 "  PATIENT: Melissa Owens DOB: September 11, 1981  REASON FOR VISIT: follow up HISTORY FROM: patient  Virtual Visit via Mychart Video Note  I connected with Melissa Owens on 09/23/2024 at  9:45 AM EST by video and verified that I am speaking with the correct person using two identifiers.   I discussed the limitations, risks, security and privacy concerns of performing an evaluation and management service by video and the availability of in person appointments. I also discussed with the patient that there may be a patient responsible charge related to this service. The patient expressed understanding and agreed to proceed.   History of Present Illness:  09/23/2024 ALL (Mychart): Melissa Owens returns for follow up for idopathic hypersomnia. She continues to do well on modafinil  200mg  daily. She continues to do well. She takes her medication around 6-6:30am. She is able to complete daily tasks without significant sleepiness. She goes to bed around 10p. She is sleeping well. She is followed regularly by PCP. She denies concerns, today.   09/19/2023 ALL (Mychart): Melissa Owens returns for follow up for idiopathic hypersomnolence. She continues modafinil  200mg  daily. Last filled 05/29/2023 for 90 tablets. She feels she is doing well. 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 May nap is needed. She exercises 5 days a week. She is feeling well and without concerns.   08/09/2022 ALL (MYchart): Melissa Owens returns for follow up for idopathic hypersomnolence. She continues modafinil  200mg  daily. She is tolerating med without obvious adverse effects. She feels hypersomnia is well controlled. She is working. She exercises regularly. She is feeling well and without concerns.   08/03/2021 ALL (Mychart): Melissa Owens is a 43 y.o. female here today for follow up for idiopathic hypersomnolence. She continues modafinil  200mg  daily. She continues to note benefit. No significant changes. She recently restarted Wellbutrin  and is tolerating  well. She is feeling well and denies concerns, today.   07/07/20 ALL:  Melissa Owens is a 43 y.o. female here today for follow up for idiopathic hypersomnolence.  Narcolepsy gene positive, hoever, MSLT showed This multiple sleep latency test reveals a mean sleep latency of 15.8 minutes with 3 out of 5 nap opportunities during which no sleep was recorded. 2. There were no sleep periods during which REM sleep was recorded. She has continued modafinil  200mg . She feels that this definitely helps. Occasionally she will take 1/2 tablet if she isn't busy. She denies adverse effects. She is feeling well today. She had labs with PCP this morning.   Observations/Objective:  Generalized: Well developed, in no acute distress  Mentation: Alert oriented to time, place, history taking. Follows all commands speech and language fluent   Assessment and Plan:  43 y.o. year old female  has a past medical history of Chicken pox, Exercise-induced asthma (11/21/2017), High cholesterol, Migraines, Urinary tract infection, and Vertebral artery dissection (08/2018). here with    ICD-10-CM   1. Idiopathic hypersomnolence  G47.11        Minh continues to do well on modafinil . PDMP shows appropriate refills, last refilled 07/23/2024 for 90 days. ESS stable. She will call when she needs a refill. Healthy lifestyle habits advised. She will follow up in 1 year, sooner if needed.    No orders of the defined types were placed in this encounter.    No orders of the defined types were placed in this encounter.     Follow Up Instructions:  I discussed the assessment and treatment plan with the patient. The patient  was provided an opportunity to ask questions and all were answered. The patient agreed with the plan and demonstrated an understanding of the instructions.   The patient was advised to call back or seek an in-person evaluation if the symptoms worsen or if the condition fails to improve as anticipated.  I  provided 15 minutes of non-face-to-face time during this encounter. Patient located at their place of residence during Mychart visit. Provider is in the office.    Yosef Krogh, NP  "

## 2024-09-23 ENCOUNTER — Encounter: Payer: Self-pay | Admitting: Family Medicine

## 2024-09-23 ENCOUNTER — Telehealth: Payer: 59 | Admitting: Family Medicine

## 2024-09-23 ENCOUNTER — Other Ambulatory Visit: Payer: Self-pay

## 2024-09-23 DIAGNOSIS — G4711 Idiopathic hypersomnia with long sleep time: Secondary | ICD-10-CM | POA: Diagnosis not present

## 2024-10-16 ENCOUNTER — Other Ambulatory Visit: Payer: Self-pay | Admitting: Obstetrics

## 2024-10-16 DIAGNOSIS — R928 Other abnormal and inconclusive findings on diagnostic imaging of breast: Secondary | ICD-10-CM

## 2024-10-17 ENCOUNTER — Ambulatory Visit
Admission: RE | Admit: 2024-10-17 | Discharge: 2024-10-17 | Disposition: A | Discharge status: Home / Self Care | Source: Ambulatory Visit | Attending: Obstetrics | Admitting: Obstetrics

## 2024-10-17 DIAGNOSIS — R928 Other abnormal and inconclusive findings on diagnostic imaging of breast: Secondary | ICD-10-CM

## 2024-10-23 ENCOUNTER — Encounter

## 2024-10-23 ENCOUNTER — Other Ambulatory Visit

## 2024-12-11 ENCOUNTER — Encounter: Admitting: Family Medicine

## 2025-09-30 ENCOUNTER — Telehealth: Admitting: Family Medicine
